# Patient Record
Sex: Male | Born: 1950 | Race: White | Hispanic: No | Marital: Married | Smoking: Former smoker
Health system: Southern US, Community
[De-identification: ages and names within clinical notes are randomized; demographics above are authoritative.]

## PROBLEM LIST (undated history)

## (undated) DIAGNOSIS — T7840XA Allergy, unspecified, initial encounter: Secondary | ICD-10-CM

## (undated) HISTORY — DX: Allergy, unspecified, initial encounter: T78.40XA

---

## 2012-06-19 ENCOUNTER — Ambulatory Visit: Payer: Self-pay | Admitting: Family Medicine

## 2012-06-19 VITALS — BP 155/84 | HR 79 | Temp 101.2°F | Resp 16 | Ht 71.5 in | Wt 224.0 lb

## 2012-06-19 DIAGNOSIS — J111 Influenza due to unidentified influenza virus with other respiratory manifestations: Secondary | ICD-10-CM

## 2012-06-19 MED ORDER — OSELTAMIVIR PHOSPHATE 75 MG PO CAPS: 75.0000 mg | ORAL_CAPSULE | Freq: Two times a day (BID) | ORAL | Status: DC

## 2012-06-19 MED ORDER — PROMETHAZINE-CODEINE 6.25-10 MG/5ML PO SYRP: 5.0000 mL | ORAL_SOLUTION | ORAL | Status: DC | PRN

## 2012-06-19 NOTE — Patient Instructions (Addendum)
Influenza Facts  Flu (influenza) is a contagious respiratory illness caused by the influenza viruses. It can cause mild to severe illness. While most healthy people recover from the flu without specific treatment and without complications, older people, young children, and people with certain health conditions are at higher risk for serious complications from the flu, including death.  CAUSES    The flu virus is spread from person to person by respiratory droplets from coughing and sneezing.   A person can also become infected by touching an object or surface with a virus on it and then touching their mouth, eye or nose.   Adults may be able to infect others from 1 day before symptoms occur and up to 7 days after getting sick. So it is possible to give someone the flu even before you know you are sick and continue to infect others while you are sick.  SYMPTOMS    Fever (usually high).   Headache.   Tiredness (can be extreme).   Cough.   Sore throat.   Runny or stuffy nose.   Body aches.   Diarrhea and vomiting may also occur, particularly in children.   These symptoms are referred to as "flu-like symptoms". A lot of different illnesses, including the common cold, can have similar symptoms.  DIAGNOSIS    There are tests that can determine if you have the flu as long you are tested within the first 2 or 3 days of illness.   A doctor's exam and additional tests may be needed to identify if you have a disease that is a complicating the flu.  RISKS AND COMPLICATIONS   Some of the complications caused by the flu include:   Bacterial pneumonia or progressive pneumonia caused by the flu virus.   Loss of body fluids (dehydration).   Worsening of chronic medical conditions, such as heart failure, asthma, or diabetes.   Sinus problems and ear infections.  HOME CARE INSTRUCTIONS    Seek medical care early on.   If you are at high risk from complications of the flu, consult your health-care provider as soon  as you develop flu-like symptoms. Those at high risk for complications include:   People 65 years or older.   People with chronic medical conditions, including diabetes.   Pregnant women.   Young children.   Your caregiver may recommend use of an antiviral medication to help treat the flu.   If you get the flu, get plenty of rest, drink a lot of liquids, and avoid using alcohol and tobacco.   You can take over-the-counter medications to relieve the symptoms of the flu if your caregiver approves. (Never give aspirin to children or teenagers who have flu-like symptoms, particularly fever).  PREVENTION   The single best way to prevent the flu is to get a flu vaccine each fall. Other measures that can help protect against the flu are:   Antiviral Medications   A number of antiviral drugs are approved for use in preventing the flu. These are prescription medications, and a doctor should be consulted before they are used.   Habits for Good Health   Cover your nose and mouth with a tissue when you cough or sneeze, throw the tissue away after you use it.   Wash your hands often with soap and water, especially after you cough or sneeze. If you are not near water, use an alcohol-based hand cleaner.   Avoid people who are sick.   If you get the   flu, stay home from work or school. Avoid contact with other people so that you do not make them sick, too.   Try not to touch your eyes, nose, or mouth as germs ore often spread this way.  IN CHILDREN, EMERGENCY WARNING SIGNS THAT NEED URGENT MEDICAL ATTENTION:   Fast breathing or trouble breathing.   Bluish skin color.   Not drinking enough fluids.   Not waking up or not interacting.   Being so irritable that the child does not want to be held.   Flu-like symptoms improve but then return with fever and worse cough.   Fever with a rash.  IN ADULTS, EMERGENCY WARNING SIGNS THAT NEED URGENT MEDICAL ATTENTION:   Difficulty breathing or shortness of breath.   Pain  or pressure in the chest or abdomen.   Sudden dizziness.   Confusion.   Severe or persistent vomiting.  SEEK IMMEDIATE MEDICAL CARE IF:   You or someone you know is experiencing any of the symptoms above. When you arrive at the emergency center,report that you think you have the flu. You may be asked to wear a mask and/or sit in a secluded area to protect others from getting sick.  MAKE SURE YOU:    Understand these instructions.   Monitor your condition.   Seek medical care if you are getting worse, or not improving.  Document Released: 05/26/2003 Document Revised: 08/15/2011 Document Reviewed: 02/19/2009  ExitCare Patient Information 2013 [employer].

## 2012-06-19 NOTE — Progress Notes (Signed)
  Subjective:    Patient ID: Russell Mckee, male    DOB: 06-13-1950, 62 y.o.   MRN: [id number]  HPI  Russell Mckee is a delightful 62 yo man with no sig PMHx.  He was in his normal state of health until last night after work when he developed some chills, which progressively worsened.  Was able to eat a little dinner. Overnight dev sweats.  This a.m. just felt cruddy and pharmacist told him to get checked out.  Usu HA rare but very bad now, no nasal cong, no ear pian, no sore throat. Mild dry cough. A little nausea but no appetite.  Urine dec but has been pushing fluids.  History reviewed. No pertinent past medical history. No current outpatient prescriptions on file prior to visit.   No Known Allergies   Review of Systems  Constitutional: Positive for fever, chills, diaphoresis, activity change, appetite change and fatigue. Negative for unexpected weight change.  HENT: Negative for ear pain, congestion, sore throat, neck stiffness and sinus pressure.   Respiratory: Positive for cough. Negative for shortness of breath.   Cardiovascular: Negative for chest pain.  Gastrointestinal: Positive for nausea. Negative for vomiting, abdominal pain, diarrhea and constipation.  Genitourinary: Positive for decreased urine volume. Negative for dysuria.  Musculoskeletal: Positive for myalgias and arthralgias. Negative for joint swelling and gait problem.  Skin: Negative for rash.  Neurological: Positive for headaches. Negative for syncope.  Hematological: Negative for adenopathy.  Psychiatric/Behavioral: Negative for sleep disturbance.      BP 155/84  Pulse 79  Temp 101.2 F (38.4 C) (Oral)  Resp 16  Ht 5' 11.5" (1.816 m)  Wt 224 lb (101.606 kg)  BMI 30.81 kg/m2 Objective:   Physical Exam  Constitutional: He is oriented to person, place, and time. He appears well-developed and well-nourished. He appears ill. No distress.  HENT:  Head: Normocephalic and atraumatic.  Right Ear: Tympanic  membrane, external ear and ear canal normal.  Left Ear: Tympanic membrane, external ear and ear canal normal.  Nose: Mucosal edema and rhinorrhea present.  Mouth/Throat: Uvula is midline and mucous membranes are normal. Posterior oropharyngeal edema present. No oropharyngeal exudate or posterior oropharyngeal erythema.  Eyes: Conjunctivae normal are normal. No scleral icterus.  Neck: Normal range of motion. Neck supple. No thyromegaly present.  Cardiovascular: Normal rate, regular rhythm, normal heart sounds and intact distal pulses.   Pulmonary/Chest: Effort normal and breath sounds normal. No respiratory distress.  Musculoskeletal: He exhibits no edema.  Lymphadenopathy:    He has no cervical adenopathy.  Neurological: He is alert and oriented to person, place, and time.  Skin: Skin is warm and dry. He is not diaphoretic. No erythema.  Psychiatric: He has a normal mood and affect. His behavior is normal.          Assessment & Plan:  Influenza - tamiflu and promethazine VC cough syrup. Alternate tylenol and ibuprofen for fevers. Rest, hygiene, push fluids. RTC if worsening.

## 2012-06-25 ENCOUNTER — Telehealth: Payer: Self-pay

## 2012-06-25 NOTE — Telephone Encounter (Signed)
Patient is not any better, the tramaflu is not working for him  Plae call [phone]

## 2012-06-26 ENCOUNTER — Ambulatory Visit (INDEPENDENT_AMBULATORY_CARE_PROVIDER_SITE_OTHER): Payer: BC Managed Care – PPO | Admitting: Family Medicine

## 2012-06-26 ENCOUNTER — Encounter: Payer: Self-pay | Admitting: Physician Assistant

## 2012-06-26 ENCOUNTER — Ambulatory Visit: Payer: BC Managed Care – PPO

## 2012-06-26 VITALS — BP 145/83 | HR 73 | Temp 98.1°F | Resp 16 | Ht 71.5 in | Wt 219.0 lb

## 2012-06-26 DIAGNOSIS — R05 Cough: Secondary | ICD-10-CM

## 2012-06-26 DIAGNOSIS — R059 Cough, unspecified: Secondary | ICD-10-CM

## 2012-06-26 DIAGNOSIS — J111 Influenza due to unidentified influenza virus with other respiratory manifestations: Secondary | ICD-10-CM

## 2012-06-26 DIAGNOSIS — J40 Bronchitis, not specified as acute or chronic: Secondary | ICD-10-CM

## 2012-06-26 DIAGNOSIS — R509 Fever, unspecified: Secondary | ICD-10-CM

## 2012-06-26 LAB — POCT CBC
HCT, POC: 49.8 % (ref 43.5–53.7)
Lymph, poc: 2 (ref 0.6–3.4)
MCHC: 32.3 g/dL (ref 31.8–35.4)
MID (cbc): 0.5 (ref 0–0.9)
MPV: 8.1 fL (ref 0–99.8)
POC Granulocyte: 3.7 (ref 2–6.9)
POC LYMPH PERCENT: 32.1 %L (ref 10–50)
POC MID %: 8.4 %M (ref 0–12)
Platelet Count, POC: 230 10*3/uL (ref 142–424)
RDW, POC: 13.5 %

## 2012-06-26 MED ORDER — PREDNISONE 20 MG PO TABS: ORAL_TABLET | ORAL | Status: DC

## 2012-06-26 MED ORDER — BENZONATATE 100 MG PO CAPS: 100.0000 mg | ORAL_CAPSULE | Freq: Three times a day (TID) | ORAL | Status: DC | PRN

## 2012-06-26 NOTE — Telephone Encounter (Signed)
Pt in office today being seen

## 2012-06-26 NOTE — Progress Notes (Signed)
Subjective:    Patient ID: Russell Mckee, male    DOB: [birth year], 62 y.o.   MRN: [id number]  HPI   Russell Mckee is a pleasant 62 yr old male who was seen here 7 days ago and diagnosed with influenza.  He completed a course of Tamiflu, but presents today stating he is not feeling better.  States he has "awful congestion" and states "I feel like I need some help".  Concerned for a secondary infection.  States he still feels feverish at times but does not have a thermometer and has not taken his temperature.  Denies chills.  Endorses one episode of night sweats last night.  The cough is productive of thick yellow sputum.  He denies nasal symptoms.  States he has occasional wheezing but denies SOB.  "I just feel weak."  Some nausea but no vomiting or diarrhea.  Appetite is ok, eating mostly soup.  States he does feel somewhat better, but thinks he should be feeling better than this by now.  Headache and body aches have resolved.  Still using cough syrup at night.     Review of Systems  Constitutional: Positive for fever, chills and diaphoresis.  HENT: Negative for ear pain, congestion, sore throat, rhinorrhea, sneezing, neck pain and neck stiffness.   Respiratory: Positive for cough, shortness of breath and wheezing.   Cardiovascular: Negative.   Gastrointestinal: Positive for nausea. Negative for vomiting and diarrhea.  Musculoskeletal: Negative for myalgias and arthralgias.  Skin: Negative.   Neurological: Negative for headaches.       Objective:   Physical Exam  Vitals reviewed. Constitutional: He is oriented to person, place, and time. He appears well-developed and well-nourished. No distress.  HENT:  Head: Normocephalic and atraumatic.  Right Ear: Tympanic membrane and ear canal normal.  Left Ear: Tympanic membrane and ear canal normal.  Mouth/Throat: Uvula is midline, oropharynx is clear and moist and mucous membranes are normal.  Eyes: Conjunctivae normal are normal. No scleral  icterus.  Neck: Neck supple.  Cardiovascular: Normal rate, regular rhythm, normal heart sounds and intact distal pulses.  Exam reveals no gallop and no friction rub.   No murmur heard. Pulmonary/Chest: Effort normal. No accessory muscle usage. Not tachypneic. No respiratory distress. He has no decreased breath sounds. He has no wheezes. He has rhonchi in the left middle field and the left lower field. He has no rales.  Abdominal: Soft. Bowel sounds are normal. There is no tenderness.  Lymphadenopathy:    He has no cervical adenopathy.  Neurological: He is alert and oriented to person, place, and time.  Skin: Skin is warm and dry.  Psychiatric: He has a normal mood and affect. His behavior is normal.      Filed Vitals:   06/26/12 0746  BP: 145/83  Pulse: 73  Temp: 98.1 F (36.7 C)  Resp: 16     [facility] reading (PRIMARY) by  Dr. Milus Glazier - heavy markings but no evidence of pneumonia    Results for orders placed in visit on 06/26/12  POCT CBC      Component Value Range   WBC 6.3  4.6 - 10.2 K/uL   Lymph, poc 2.0  0.6 - 3.4   POC LYMPH PERCENT 32.1  10 - 50 %L   MID (cbc) 0.5  0 - 0.9   POC MID % 8.4  0 - 12 %M   POC Granulocyte 3.7  2 - 6.9   Granulocyte percent 59.5  37 - 80 %G  RBC 5.60  4.69 - 6.13 M/uL   Hemoglobin 16.1  14.1 - 18.1 g/dL   HCT, POC 98.1  19.1 - 53.7 %   MCV 88.9  80 - 97 fL   MCH, POC 28.8  27 - 31.2 pg   MCHC 32.3  31.8 - 35.4 g/dL   RDW, POC 47.8     Platelet Count, POC 230  142 - 424 K/uL   MPV 8.1  0 - 99.8 fL         Assessment & Plan:   1. Bronchitis  benzonatate (TESSALON) 100 MG capsule, predniSONE (DELTASONE) 20 MG tablet  2. Cough  POCT CBC, DG Chest 2 View  3. Fever  POCT CBC, DG Chest 2 View  4. Influenza      Russell Mckee is a very pleasant 62 yr old male here with continued cough post-influenza.  WBC count is 6.3.  CXR shows no evidence of pneumonia.  Suspect post-influenza bronchitis.  Discussed with pt that I do not  think an antibiotic will be beneficial as there is no evidence of pneumonia.  Will try a steroid taper.  Will also add Tessalon to treat cough.  Pt may continue cough syrup at night if needed.  Encouraged continued fluids and rest.  Discussed RTC precautions, specifically continued fevers or worsening cough.  Pt understands and is in agreement.  Will let us know if worsening or not improving.

## 2012-06-26 NOTE — Patient Instructions (Addendum)
Your x-ray and blood count are both normal today.  Begin taking the steroid (prednisone) as directed.  Continue to use the cough syrup at night if needed.  You may also use Tessalon Perles during the day for cough if needed up to 3 times per day.  Continue drinking plenty of fluids.  You do not need to continue Advil or Tylenol unless you need it for pain relief.  If you begin to have fevers again, please let us know.  If you feel like your cough is worsening let us know.

## 2013-12-30 IMAGING — CR DG CHEST 2V
2 series · 2 of 2 positions shown · non-contrast
Comparison: None.

CLINICAL DATA: Cough and fatigue

CHEST - 2 VIEW

[PA]
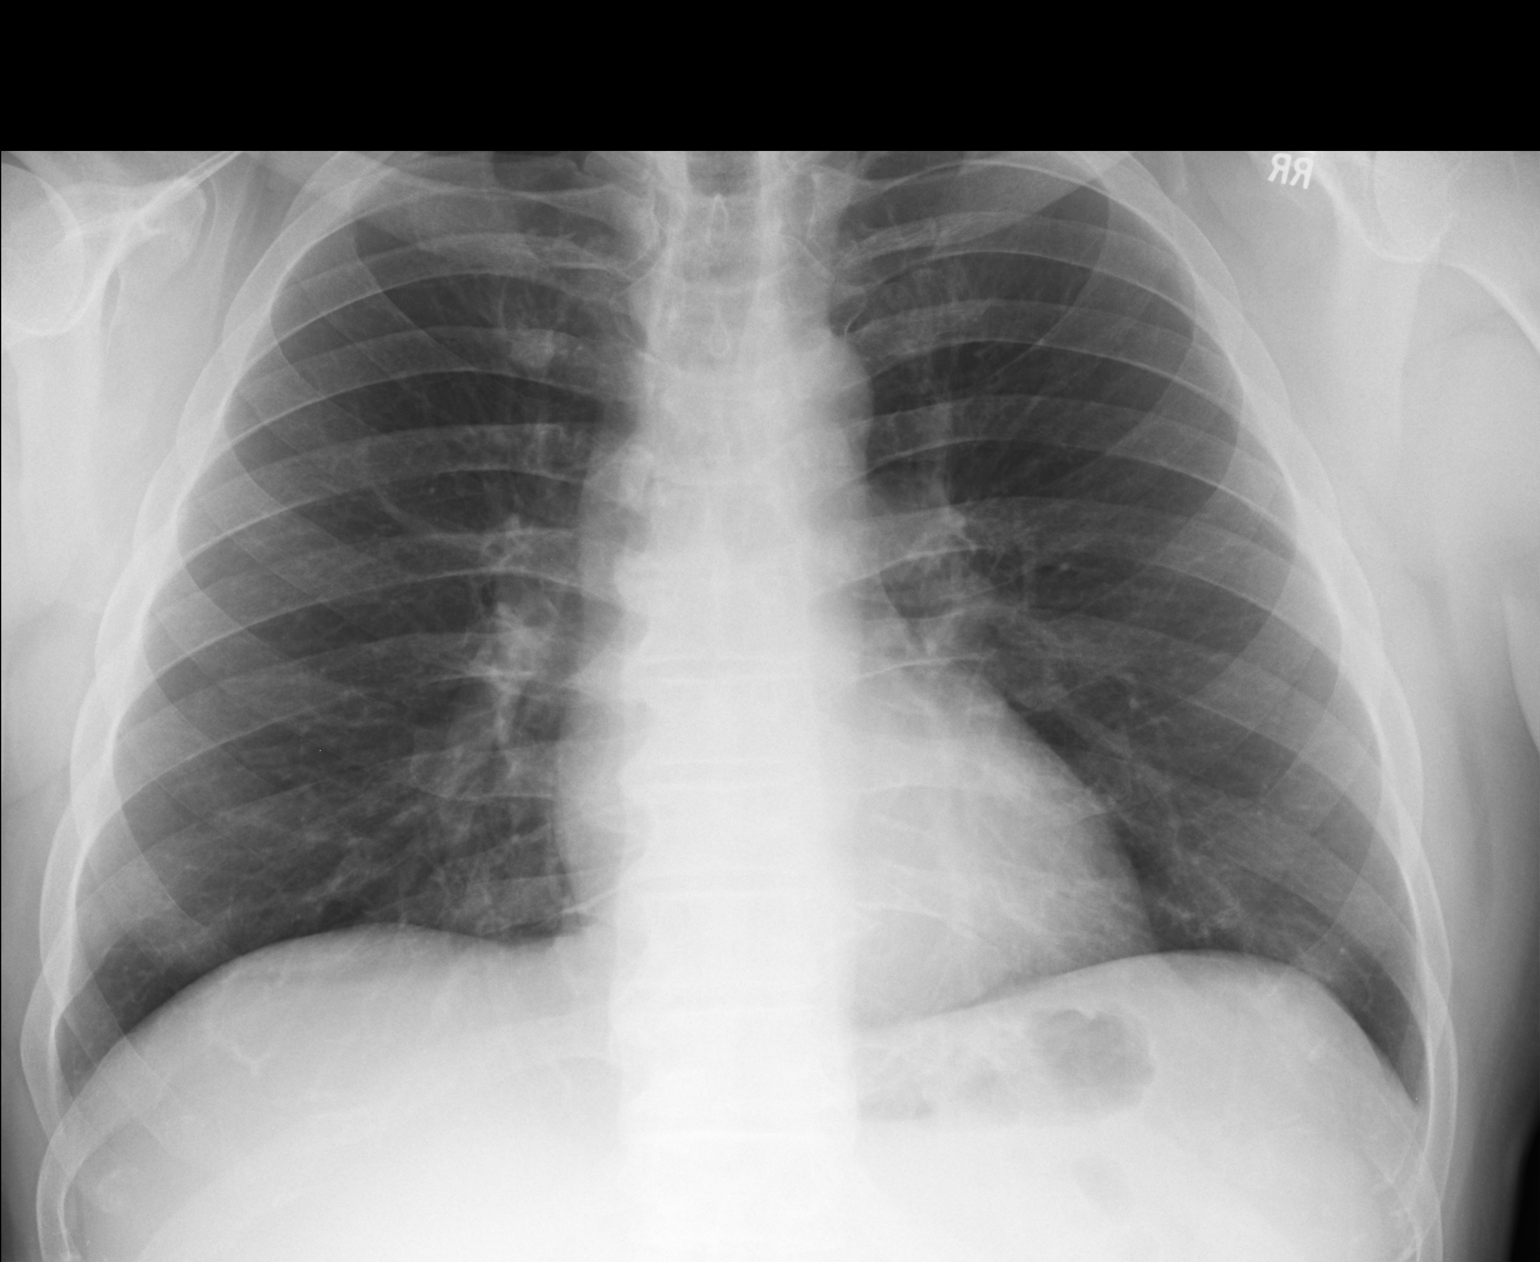

[lateral]
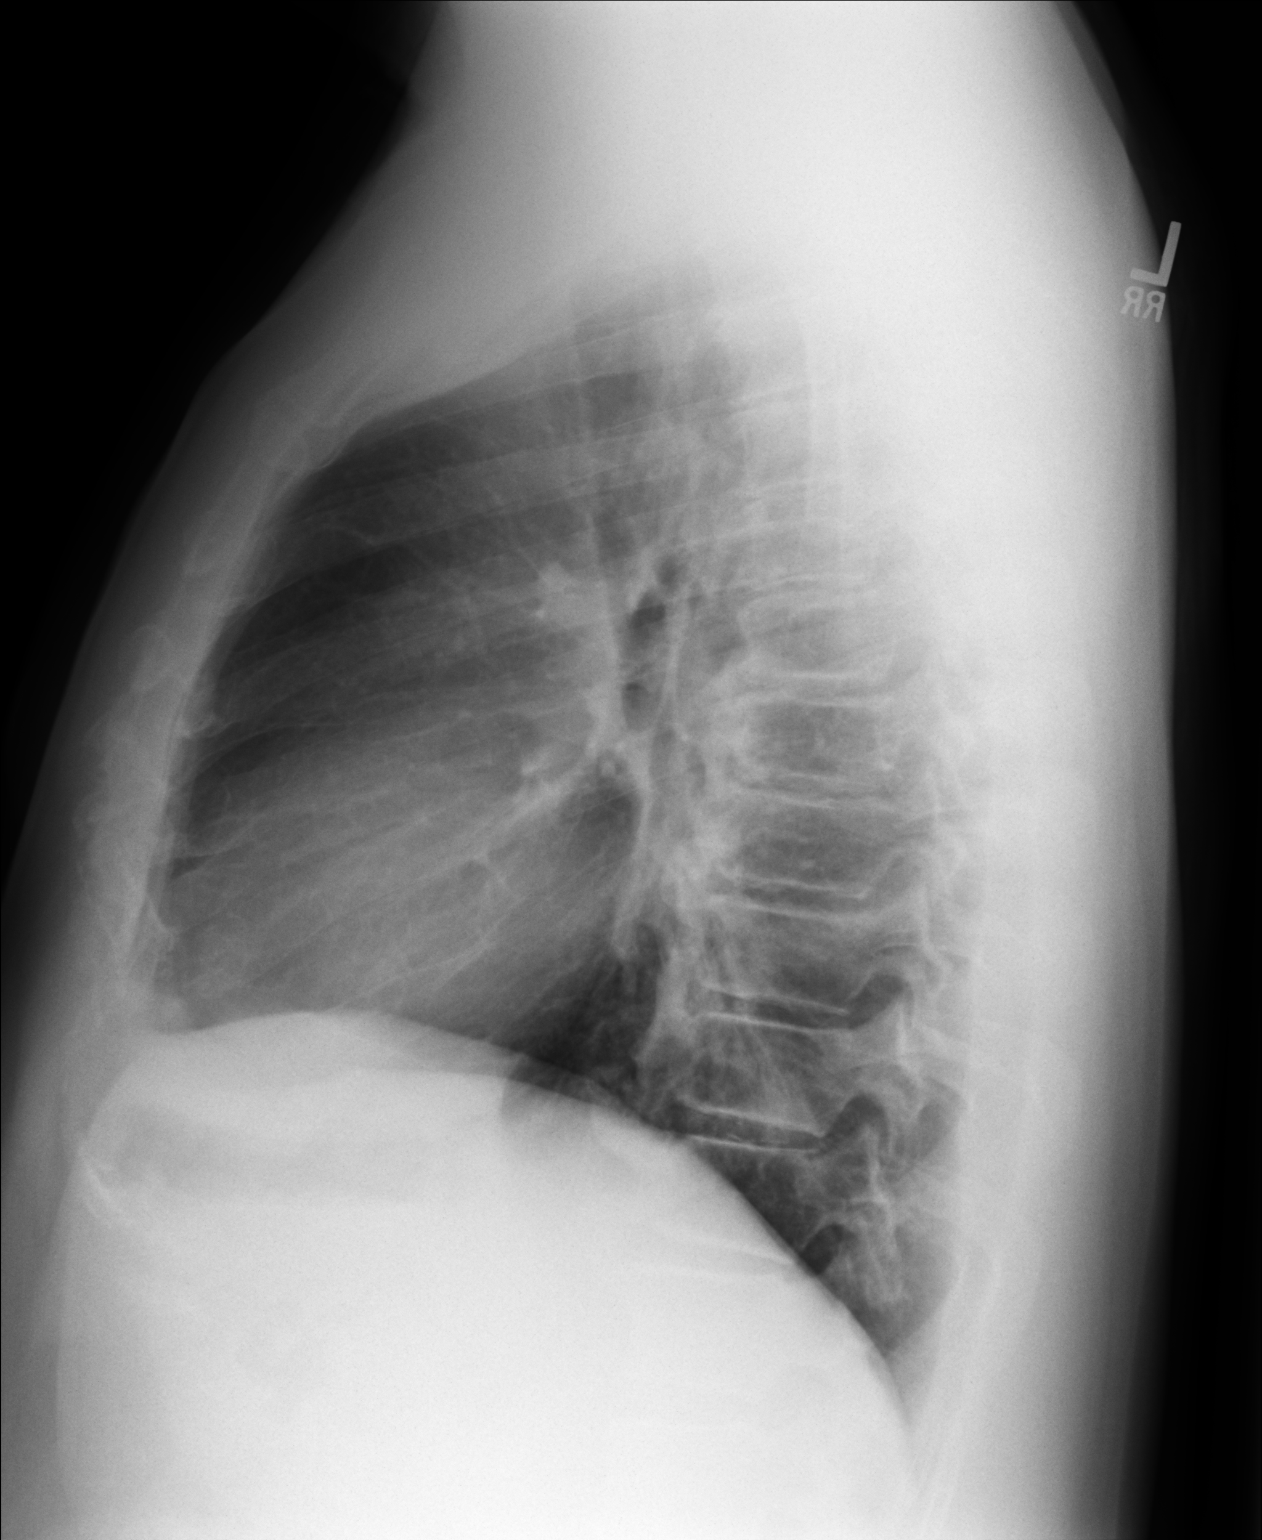

[2 of 2 positions shown; findings below may reference images not displayed]

FINDINGS: Normal mediastinum and cardiac silhouette.  There is some
linear central bronchovascular thickening.  No focal infiltrate.
No effusion, infiltrate, or pneumothorax peri Degenerative
osteophytosis of the thoracic spine.
IMPRESSION: No evidence of pneumonia.

## 2020-03-03 ENCOUNTER — Ambulatory Visit: Payer: Self-pay | Attending: Internal Medicine

## 2020-03-03 DIAGNOSIS — Z23 Encounter for immunization: Secondary | ICD-10-CM

## 2020-03-03 NOTE — Progress Notes (Signed)
   Covid-19 Vaccination Clinic  Name:  Russell Mckee    MRN: [id number] DOB: June 12, 1950  03/03/2020  Russell Mckee was observed post Covid-19 immunization for 15 minutes without incident. He was provided with Vaccine Information Sheet and instruction to access the V-Safe system.   Russell Mckee was instructed to call 911 with any severe reactions post vaccine: Marland Kitchen Difficulty breathing  . Swelling of face and throat  . A fast heartbeat  . A bad rash all over body  . Dizziness and weakness

## 2020-09-07 DIAGNOSIS — J069 Acute upper respiratory infection, unspecified: Secondary | ICD-10-CM | POA: Diagnosis not present

## 2020-10-13 DIAGNOSIS — L814 Other melanin hyperpigmentation: Secondary | ICD-10-CM | POA: Diagnosis not present

## 2020-10-13 DIAGNOSIS — D1801 Hemangioma of skin and subcutaneous tissue: Secondary | ICD-10-CM | POA: Diagnosis not present

## 2020-10-13 DIAGNOSIS — D224 Melanocytic nevi of scalp and neck: Secondary | ICD-10-CM | POA: Diagnosis not present

## 2020-10-13 DIAGNOSIS — D225 Melanocytic nevi of trunk: Secondary | ICD-10-CM | POA: Diagnosis not present

## 2020-10-13 DIAGNOSIS — D2271 Melanocytic nevi of right lower limb, including hip: Secondary | ICD-10-CM | POA: Diagnosis not present

## 2020-10-13 DIAGNOSIS — L570 Actinic keratosis: Secondary | ICD-10-CM | POA: Diagnosis not present

## 2021-11-15 ENCOUNTER — Ambulatory Visit: Payer: PPO | Admitting: Podiatry

## 2021-11-15 ENCOUNTER — Encounter: Payer: Self-pay | Admitting: Podiatry

## 2021-11-15 ENCOUNTER — Other Ambulatory Visit: Payer: PPO

## 2021-11-15 ENCOUNTER — Ambulatory Visit (INDEPENDENT_AMBULATORY_CARE_PROVIDER_SITE_OTHER): Payer: PPO

## 2021-11-15 DIAGNOSIS — M79672 Pain in left foot: Secondary | ICD-10-CM | POA: Diagnosis not present

## 2021-11-15 DIAGNOSIS — M79671 Pain in right foot: Secondary | ICD-10-CM

## 2021-11-15 DIAGNOSIS — M722 Plantar fascial fibromatosis: Secondary | ICD-10-CM | POA: Diagnosis not present

## 2021-11-15 DIAGNOSIS — M2041 Other hammer toe(s) (acquired), right foot: Secondary | ICD-10-CM

## 2021-11-15 MED ORDER — TRIAMCINOLONE ACETONIDE 10 MG/ML IJ SUSP
10.0000 mg | Freq: Once | INTRAMUSCULAR | Status: AC
  Administered 2021-11-15: 10 mg

## 2021-11-15 NOTE — Progress Notes (Signed)
Subjective:   Patient ID: Russell Mckee, male   DOB: 71 y.o.   MRN: [id number]   HPI Patient presents with a number of months pain in the left plantar heel its been very sore and also movement of the right second digit and medial and upwards direction.  States he likes to walk and has been unable to for the last few months because of pain.  Patient does not smoke likes to be active   Review of Systems  All other systems reviewed and are negative.       Objective:  Physical Exam Vitals and nursing note reviewed.  Constitutional:      Appearance: He is well-developed.  Pulmonary:     Effort: Pulmonary effort is normal.  Musculoskeletal:        General: Normal range of motion.  Skin:    General: Skin is warm.  Neurological:     Mental Status: He is alert.     Neurovascular status found to be intact muscle strength found to be adequate range of motion within normal limits.  Patient does have moderate equinus is noted on the left plantar fascia to have exquisite discomfort in the medial band insertion into calcaneus and on the right there is medial and dorsal dislocation of the second digit moderate in its intensity.  Patient has good digital perfusion well oriented x3     Assessment:  Acute plantar fasciitis left with inflammation fluid buildup along with probability for flexor plate dislocation second MPJ right     Plan:  H&P reviewed condition and went ahead today did sterile prep and injected the plantar fascial insertion left 3 mg Kenalog 5 mg Xylocaine and instructed on fascial bracing and instructed on the usage of this to take stress off the plantar heel and reduce the weightbearing forces.  Do not recommend current treatment of right but may require digital fusion that I made him aware of  X-rays indicate spur formation plantar heel no indication stress fracture arthritis and right did indicate medial dorsal dislocation of the digit

## 2021-11-15 NOTE — Patient Instructions (Signed)

## 2021-11-19 ENCOUNTER — Ambulatory Visit (INDEPENDENT_AMBULATORY_CARE_PROVIDER_SITE_OTHER): Payer: PPO | Admitting: Family Medicine

## 2021-11-19 ENCOUNTER — Encounter: Payer: Self-pay | Admitting: Family Medicine

## 2021-11-19 VITALS — BP 140/90 | HR 58 | Temp 98.0°F | Ht 71.5 in | Wt 215.0 lb

## 2021-11-19 DIAGNOSIS — Z0000 Encounter for general adult medical examination without abnormal findings: Secondary | ICD-10-CM

## 2021-11-19 DIAGNOSIS — Z13220 Encounter for screening for lipoid disorders: Secondary | ICD-10-CM

## 2021-11-19 DIAGNOSIS — Z1211 Encounter for screening for malignant neoplasm of colon: Secondary | ICD-10-CM | POA: Diagnosis not present

## 2021-11-19 DIAGNOSIS — Z1159 Encounter for screening for other viral diseases: Secondary | ICD-10-CM | POA: Diagnosis not present

## 2021-11-19 LAB — COMPREHENSIVE METABOLIC PANEL
ALT: 15 U/L (ref 0–53)
AST: 24 U/L (ref 0–37)
Albumin: 4.7 g/dL (ref 3.5–5.2)
Alkaline Phosphatase: 43 U/L (ref 39–117)
BUN: 14 mg/dL (ref 6–23)
CO2: 27 mEq/L (ref 19–32)
Calcium: 10.1 mg/dL (ref 8.4–10.5)
Chloride: 100 mEq/L (ref 96–112)
Creatinine, Ser: 1.01 mg/dL (ref 0.40–1.50)
GFR: 75.82 mL/min (ref >60.00)
Glucose, Bld: 93 mg/dL (ref 70–99)
Potassium: 4.8 mEq/L (ref 3.5–5.1)
Sodium: 136 mEq/L (ref 135–145)
Total Bilirubin: 0.7 mg/dL (ref 0.2–1.2)
Total Protein: 7.7 g/dL (ref 6.0–8.3)

## 2021-11-19 LAB — CBC WITH DIFFERENTIAL/PLATELET
Basophils Absolute: 0 10*3/uL (ref 0.0–0.1)
Basophils Relative: 0.3 % (ref 0.0–3.0)
Eosinophils Absolute: 0.1 10*3/uL (ref 0.0–0.7)
Eosinophils Relative: 1.7 % (ref 0.0–5.0)
HCT: 47.8 % (ref 39.0–52.0)
Hemoglobin: 16.1 g/dL (ref 13.0–17.0)
Lymphocytes Relative: 30.1 % (ref 12.0–46.0)
Lymphs Abs: 2.3 10*3/uL (ref 0.7–4.0)
MCHC: 33.8 g/dL (ref 30.0–36.0)
MCV: 86.1 fl (ref 78.0–100.0)
Monocytes Absolute: 0.4 10*3/uL (ref 0.1–1.0)
Monocytes Relative: 5.8 % (ref 3.0–12.0)
Neutro Abs: 4.8 10*3/uL (ref 1.4–7.7)
Neutrophils Relative %: 62.1 % (ref 43.0–77.0)
Platelets: 241 10*3/uL (ref 150.0–400.0)
RBC: 5.55 Mil/uL (ref 4.22–5.81)
RDW: 14 % (ref 11.5–15.5)
WBC: 7.8 10*3/uL (ref 4.0–10.5)

## 2021-11-19 LAB — LIPID PANEL
Cholesterol: 230 mg/dL — ABNORMAL HIGH (ref 0–200)
HDL: 62.2 mg/dL (ref >39.00)
LDL Cholesterol: 139 mg/dL — ABNORMAL HIGH (ref 0–99)
NonHDL: 167.47
Total CHOL/HDL Ratio: 4
Triglycerides: 140 mg/dL (ref 0.0–149.0)
VLDL: 28 mg/dL (ref 0.0–40.0)

## 2021-11-19 NOTE — Patient Instructions (Signed)
Welcome to [facility]! It was a pleasure meeting you today.  As discussed, Please schedule a 12 month follow up visit today.  Check pharmacy for Tdap  PLEASE NOTE:  If you had any LAB tests please let us know if you have not heard back within a few days. You may see your results on MyChart before we have a chance to review them but we will give you a call once they are reviewed by [place]. If we ordered any REFERRALS today, please let us know if you have not heard from their office within the next week.  Let us know through MyChart if you are needing REFILLS, or have your pharmacy send [place] the request. You can also use MyChart to communicate with me or any office staff.  Please try these tips to maintain a healthy lifestyle:  Eat most of your calories during the day when you are active. Eliminate processed foods including packaged sweets (pies, cakes, cookies), reduce intake of potatoes, white bread, white pasta, and white rice. Look for whole grain options, oat flour or almond flour.  Each meal should contain half fruits/vegetables, one quarter protein, and one quarter carbs (no bigger than a computer mouse).  Cut down on sweet beverages. This includes juice, soda, and sweet tea. Also watch fruit intake, though this is a healthier sweet option, it still contains natural sugar! Limit to 3 servings daily.  Drink at least 1 glass of water with each meal and aim for at least 8 glasses per day  Exercise at least 150 minutes every week.

## 2021-11-19 NOTE — Progress Notes (Signed)
New Patient Office Visit  Subjective:  Patient ID: Russell Mckee, male    DOB: [birth year]  Age: 71 y.o. MRN: [id number]  CC:  Chief Complaint  Patient presents with   Establish Care    Need new PCP Not fasting     HPI Russell Mckee presents for new pt to est Exercises 1-1.5 hrs/day Since April pain L heel so stopped walking.  Saw Dr. Paulla Dolly this week. Told plantar fasciitis and got cortisone and brace and start walking.  Cpx-healthy diet. Still working(owns business). No other major complaints.    Past Medical History:  Diagnosis Date   Allergy     History reviewed. No pertinent surgical history.  Family History  Problem Relation Age of Onset   Diabetes Mother    Lung cancer Mother    Dementia Father    Aneurysm Sister    Heart disease Maternal Grandmother    Early death Maternal Grandmother    Diabetes Maternal Grandmother    Breast cancer Maternal Grandmother    Stroke Maternal Grandfather     Social History   Socioeconomic History   Marital status: Single    Spouse name: Not on file   Number of children: 0   Years of education: Not on file   Highest education level: Not on file  Occupational History   Not on file  Tobacco Use   Smoking status: Former    Types: Cigarettes    Quit date: 1979    Years since quitting: 44.4   Smokeless tobacco: Never  Vaping Use   Vaping Use: Not on file  Substance and Sexual Activity   Alcohol use: Yes    Alcohol/week: 2.0 standard drinks of alcohol    Types: 2 Shots of liquor per week   Drug use: Not Currently   Sexual activity: Never  Other Topics Concern   Not on file  Social History Narrative   [employer]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    Social Determinants of Health   Financial Resource Strain: Not on file  Food Insecurity: Not on file  Transportation Needs: Not on file  Physical Activity: Not on file  Stress: Not on file  Social Connections: Not on file  Intimate Partner Violence:  Not on file    ROS  ROS: Gen: no fever, chills  Skin: no rash, itching ENT: no ear pain, ear drainage, nasal congestion, rhinorrhea, sinus pressure, sore throat Eyes: no blurry vision, double vision Resp: no cough, wheeze,SOB CV: no CP, palpitations, LE edema,  GI: no heartburn, n/v/d/c, abd pain GU: no dysuria, urgency, frequency, hematuria MSK: knees Neuro: no dizziness, headache, weakness, vertigo Psych: no depression, anxiety, insomnia, SI   Objective:   Today's Vitals: BP 140/90   Pulse (!) 58   Temp 98 F (36.7 C) (Temporal)   Ht 5' 11.5" (1.816 m)   Wt 215 lb (97.5 kg)   SpO2 96%   BMI 29.57 kg/m   Physical Exam  Gen: WDWN NAD wm HEENT: NCAT, conjunctiva not injected, sclera nonicteric TM WNL B, OP moist, no exudates  NECK:  supple, no thyromegaly, no nodes, no carotid bruits CARDIAC: RRR, S1S2+, no murmur. DP 2+B LUNGS: CTAB. No wheezes ABDOMEN:  BS+, soft, NTND, No HSM, no masses EXT:  no edema MSK: no gross abnormalities.  NEURO: A&O x3.  CN II-XII intact.  PSYCH: normal mood. Good eye contact   Assessment & Plan:   Problem List Items Addressed This Visit   None Visit Diagnoses  Wellness examination    -  Primary   Relevant Orders   CBC with Differential/Platelet   Comprehensive metabolic panel   Screen for colon cancer       Relevant Orders   Ambulatory referral to Gastroenterology   Screening for lipid disorders       Relevant Orders   Lipid panel   Encounter for hepatitis C screening test for low risk patient       Relevant Orders   Hepatitis C antibody      [place] guidance.  Advised to get Tdap even if cash pay d/t occupation.  Screening hep C, pt has done HIV in past.  Refer colon.  Check lipids.  Will check cbc,cmp-Knows cash pay for cbc/cmp.  Monitors bp's at home 120's/70's.  Cont to monitor.  F/u 1 yr and prn  Outpatient Encounter Medications as of 11/19/2021  Medication Sig   ibuprofen (ADVIL) 200 MG tablet Take 200 mg by  mouth daily.   MELATONIN PO Take 2 mg by mouth at bedtime.   [DISCONTINUED] benzonatate (TESSALON) 100 MG capsule Take 1-2 capsules (100-200 mg total) by mouth 3 (three) times daily as needed for cough.   [DISCONTINUED] oseltamivir (TAMIFLU) 75 MG capsule Take 1 capsule (75 mg total) by mouth 2 (two) times daily.   [DISCONTINUED] predniSONE (DELTASONE) 20 MG tablet Take 3 PO QAM x3days, 2 PO QAM x3days, 1 PO QAM x3days   [DISCONTINUED] promethazine-codeine (PHENERGAN WITH CODEINE) 6.25-10 MG/5ML syrup Take 5 mLs by mouth every 4 (four) hours as needed for cough.   No facility-administered encounter medications on file as of 11/19/2021.    Follow-up: Return in about 1 year (around 11/20/2022) for annual.   Wellington Hampshire, MD

## 2021-11-22 LAB — HEPATITIS C ANTIBODY
Hepatitis C Ab: NONREACTIVE
SIGNAL TO CUT-OFF: 0.03 (ref <1.00)

## 2021-12-09 ENCOUNTER — Ambulatory Visit (INDEPENDENT_AMBULATORY_CARE_PROVIDER_SITE_OTHER): Payer: PPO | Admitting: Podiatry

## 2021-12-09 DIAGNOSIS — M722 Plantar fascial fibromatosis: Secondary | ICD-10-CM

## 2021-12-10 NOTE — Progress Notes (Signed)
Subjective:   Patient ID: Russell Mckee, male   DOB: 71 y.o.   MRN: [id number]   HPI Patient states he is improved states he still has mild discomfort but it is gotten quite a bit better than previous   ROS      Objective:  Physical Exam  Neurovascular status intact with discomfort plantar fascia still present but better than it was previously     Assessment:  Improvement in plantar fascial inflammation with pain still present but quite a bit better     Plan:  Reviewed physical therapy anti-inflammatories and supportive shoe gear usage and at this point I am discharging but patient is encouraged to call with questions concerns which may arise

## 2022-02-28 ENCOUNTER — Encounter: Payer: Self-pay | Admitting: *Deleted

## 2022-03-04 ENCOUNTER — Telehealth: Payer: Self-pay | Admitting: Family Medicine

## 2022-03-04 NOTE — Telephone Encounter (Signed)
Copied from [place] [id number]. Topic: Medicare AWV >> Mar 04, 2022  9:28 AM Devoria Glassing wrote: Reason for CRM: Left message for patient to schedule Annual Wellness Visit.  Please schedule with Nurse Health Advisor Charlott Rakes, RN at [facility]. This appt can be telephone or office visit. Please call [phone] ask for [facility]

## 2022-03-24 ENCOUNTER — Telehealth: Payer: Self-pay | Admitting: Family Medicine

## 2022-03-24 NOTE — Telephone Encounter (Signed)
Copied from [place] [phone]. Topic: Medicare AWV >> Mar 24, 2022 11:57 AM Devoria Glassing wrote: Reason for CRM: Left message for patient to schedule Annual Wellness Visit.  Please schedule with Nurse Health Advisor Charlott Rakes, RN at [facility]. This appt can be telephone or office visit. Please call [phone] ask for [facility]

## 2022-03-31 ENCOUNTER — Telehealth: Payer: Self-pay | Admitting: Family Medicine

## 2022-03-31 NOTE — Telephone Encounter (Signed)
Copied from [place] [id number]. Topic: Medicare AWV >> Mar 31, 2022  9:40 AM Gillis Santa wrote: Reason for CRM: LEFT VOICEMAIL FOR PT TO CALL [phone] TO SCHEDULE AWVI WITH HEALTH COACH [facility]

## 2022-05-19 ENCOUNTER — Encounter: Payer: Self-pay | Admitting: *Deleted

## 2022-05-23 ENCOUNTER — Telehealth: Payer: Self-pay | Admitting: Family Medicine

## 2022-05-23 NOTE — Telephone Encounter (Signed)
Copied from [place] [phone]. Topic: Medicare AWV >> May 23, 2022  9:20 AM Gillis Santa wrote: Reason for CRM: LVM PATIENT TO CALL [phone] [place]

## 2022-06-13 ENCOUNTER — Ambulatory Visit (INDEPENDENT_AMBULATORY_CARE_PROVIDER_SITE_OTHER): Payer: PPO

## 2022-06-13 VITALS — Wt 212.0 lb

## 2022-06-13 DIAGNOSIS — Z0000 Encounter for general adult medical examination without abnormal findings: Secondary | ICD-10-CM

## 2022-06-13 NOTE — Progress Notes (Signed)
I connected with  Russell Mckee on 06/13/22 by a audio enabled telemedicine application and verified that I am speaking with the correct person using two identifiers.  Patient Location: Home  Provider Location: Office/Clinic  I discussed the limitations of evaluation and management by telemedicine. The patient expressed understanding and agreed to proceed.   Subjective:   Russell Mckee is a 72 y.o. male who presents for an Initial Medicare Annual Wellness Visit.  Review of Systems     Cardiac Risk Factors include: advanced age (>25mn, >>56women);male gender     Objective:    Today's Vitals   06/13/22 1411  Weight: 212 lb (96.2 kg)   Body mass index is 29.16 kg/m.     06/13/2022    2:15 PM  Advanced Directives  Does Patient Have a Medical Advance Directive? Yes  Type of AParamedicof AEstell ManorLiving will  Copy of [place] Chart? No - copy requested    Current Medications (verified) Outpatient Encounter Medications as of 06/13/2022  Medication Sig   ibuprofen (ADVIL) 200 MG tablet Take 200 mg by mouth daily.   MELATONIN PO Take 2 mg by mouth at bedtime.   No facility-administered encounter medications on file as of 06/13/2022.    Allergies (verified) Patient has no known allergies.   History: Past Medical History:  Diagnosis Date   Allergy    History reviewed. No pertinent surgical history. Family History  Problem Relation Age of Onset   Diabetes Mother    Lung cancer Mother    Dementia Father    Aneurysm Sister    Heart disease Maternal Grandmother    Early death Maternal Grandmother    Diabetes Maternal Grandmother    Breast cancer Maternal Grandmother    Stroke Maternal Grandfather    Social History   Socioeconomic History   Marital status: Single    Spouse name: Not on file   Number of children: 0   Years of education: Not on file   Highest education level: Not on file  Occupational History    Not on file  Tobacco Use   Smoking status: Former    Types: Cigarettes    Quit date: 1979    Years since quitting: 45.0   Smokeless tobacco: Never  Vaping Use   Vaping Use: Not on file  Substance and Sexual Activity   Alcohol use: Yes    Alcohol/week: 2.0 standard drinks of alcohol    Types: 2 Shots of liquor per week   Drug use: Not Currently   Sexual activity: Never  Other Topics Concern   Not on file  Social History Narrative   [employer]-florist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    Social Determinants of Health   Financial Resource Strain: Low Risk  (06/13/2022)   Overall Financial Resource Strain (CARDIA)    Difficulty of Paying Living Expenses: Not hard at all  Food Insecurity: No Food Insecurity (06/13/2022)   Hunger Vital Sign    Worried About Running Out of Food in the Last Year: Never true    Ran Out of Food in the Last Year: Never true  Transportation Needs: No Transportation Needs (06/13/2022)   PRAPARE - THydrologist(Medical): No    Lack of Transportation (Non-Medical): No  Physical Activity: Sufficiently Active (06/13/2022)   Exercise Vital Sign    Days of Exercise per Week: 5 days    Minutes of Exercise per Session: 70 min  Stress: No  Stress Concern Present (06/13/2022)   [place]    Feeling of Stress : Not at all  Social Connections: Moderately Isolated (06/13/2022)   Social Connection and Isolation Panel [NHANES]    Frequency of Communication with Friends and Family: More than three times a week    Frequency of Social Gatherings with Friends and Family: More than three times a week    Attends Religious Services: Never    Marine scientist or Organizations: Yes    Attends Music therapist: 1 to 4 times per year    Marital Status: Never married    Tobacco Counseling Counseling given: Not Answered   Clinical Intake:  Pre-visit preparation  completed: Yes  Pain : No/denies pain     BMI - recorded: 29.16 Nutritional Status: BMI 25 -29 Overweight Nutritional Risks: None Diabetes: No  How often do you need to have someone help you when you read instructions, pamphlets, or other written materials from your doctor or pharmacy?: 1 - Never  Diabetic?no  Interpreter Needed?: No  Information entered by :: Charlott Rakes, LPN   Activities of Daily Living    06/13/2022    2:16 PM  In your present state of health, do you have any difficulty performing the following activities:  Hearing? 0  Vision? 0  Difficulty concentrating or making decisions? 0  Walking or climbing stairs? 0  Dressing or bathing? 0  Doing errands, shopping? 0  Preparing Food and eating ? N  Using the Toilet? N  In the past six months, have you accidently leaked urine? N  Do you have problems with loss of bowel control? N  Managing your Medications? N  Managing your Finances? N  Housekeeping or managing your Housekeeping? N    Patient Care Team: Tawnya Crook, MD as PCP - General (Family Medicine)  Indicate any recent Medical Services you may have received from other than [facility] providers in the past year (date may be approximate).     Assessment:   This is a routine wellness examination for Russell Mckee.  Hearing/Vision screen Hearing Screening - Comments:: Pt denies any hearing issues  Vision Screening - Comments:: Pt follows up with [facility] on [address]   Dietary issues and exercise activities discussed: Current Exercise Habits: Home exercise routine, Type of exercise: walking;Other - see comments, Time (Minutes): > 60, Frequency (Times/Week): 5, Weekly Exercise (Minutes/Week): 0   Goals Addressed             This Visit's Progress    Patient Stated       Live a healthy lifestyle        Depression Screen    06/13/2022    2:13 PM 11/19/2021   10:42 AM 11/19/2021   10:41 AM  PHQ 2/9 Scores  PHQ - 2 Score 0 0 0  PHQ- 9  Score  1     Fall Risk    06/13/2022    2:16 PM 11/19/2021   10:33 AM  Fall Risk   Falls in the past year? 0 1  Number falls in past yr: 0 0  Injury with Fall? 0 0  Comment  sore knee  Risk for fall due to : Impaired vision Other (Comment)  Risk for fall due to: Comment  slipped  Follow up Falls prevention discussed Falls prevention discussed    FALL RISK PREVENTION PERTAINING TO THE HOME:  Any stairs in or around the home? Yes  If so, are there any without handrails? No  Home free of loose throw rugs in walkways, pet beds, electrical cords, etc? Yes  Adequate lighting in your home to reduce risk of falls? Yes   ASSISTIVE DEVICES UTILIZED TO PREVENT FALLS:  Life alert? No  Use of a cane, walker or w/c? No  Grab bars in the bathroom? Yes  Shower chair or bench in shower? Yes  Elevated toilet seat or a handicapped toilet? No   TIMED UP AND GO:  Was the test performed? No .   Cognitive Function:        06/13/2022    2:16 PM  6CIT Screen  What Year? 0 points  What month? 0 points  What time? 0 points  Count back from 20 0 points  Months in reverse 0 points  Repeat phrase 0 points  Total Score 0 points    Immunizations Immunization History  Administered Date(s) Administered   COVID-19, mRNA, vaccine(Comirnaty)12 years and older 04/06/2022   Influenza-Unspecified 04/06/2022   PFIZER Comirnaty(Gray Top)Covid-19 Tri-Sucrose Vaccine 03/03/2020   PFIZER(Purple Top)SARS-COV-2 Vaccination 07/10/2019, 07/31/2019, 03/03/2020, 09/28/2020   Pfizer Covid-19 Vaccine Bivalent Booster 71yr & up 02/16/2021   Pneumococcal Conjugate-13 05/11/2021   Pneumococcal Polysaccharide-23 09/14/2020   RSV,unspecified 04/06/2022   Zoster Recombinat (Shingrix) 02/16/2021, 05/27/2021    TDAP status: Due, Education has been provided regarding the importance of this vaccine. Advised may receive this vaccine at local pharmacy or Health Dept. Aware to provide a copy of the vaccination record if  obtained from local pharmacy or Health Dept. Verbalized acceptance and understanding.  Flu Vaccine status: Up to date  Pneumococcal vaccine status: Up to date  Covid-19 vaccine status: Completed vaccines  Qualifies for Shingles Vaccine? Yes   Zostavax completed Yes   Shingrix Completed?: Yes  Screening Tests Health Maintenance  Topic Date Due   DTaP/Tdap/Td (1 - Tdap) Never done   COLONOSCOPY (Pts 45-447yrInsurance coverage will need to be confirmed)  Never done   Medicare Annual Wellness (AWV)  06/14/2023   Pneumonia Vaccine 6560Years old  Completed   INFLUENZA VACCINE  Completed   COVID-19 Vaccine  Completed   Hepatitis C Screening  Completed   Zoster Vaccines- Shingrix  Completed   HPV VACCINES  Aged Out    Health Maintenance  Health Maintenance Due  Topic Date Due   DTaP/Tdap/Td (1 - Tdap) Never done   COLONOSCOPY (Pts 45-4944yrnsurance coverage will need to be confirmed)  Never done    Colorectal cancer screening: Referral to GI placed 06/13/22. Pt aware the office will call re: appt.   Additional Screening:  Hepatitis C Screening: Completed 11/19/21  Vision Screening: Recommended annual ophthalmology exams for early detection of glaucoma and other disorders of the eye. Is the patient up to date with their annual eye exam?  Yes  Who is the provider or what is the name of the office in which the patient attends annual eye exams? [facility]  If pt is not established with a provider, would they like to be referred to a provider to establish care? No .   Dental Screening: Recommended annual dental exams for proper oral hygiene  Community Resource Referral / Chronic Care Management: CRR required this visit?  No   CCM required this visit?  No      Plan:     I have personally reviewed and noted the following in the patient's chart:   Medical and social history Use of alcohol, tobacco or illicit  drugs  Current medications and supplements including opioid  prescriptions. Patient is not currently taking opioid prescriptions. Functional ability and status Nutritional status Physical activity Advanced directives List of other physicians Hospitalizations, surgeries, and ER visits in previous 12 months Vitals Screenings to include cognitive, depression, and falls Referrals and appointments  In addition, I have reviewed and discussed with patient certain preventive protocols, quality metrics, and best practice recommendations. A written personalized care plan for preventive services as well as general preventive health recommendations were provided to patient.     Willette Brace, LPN   0/12/2180   Nurse Notes: none

## 2022-06-13 NOTE — Patient Instructions (Signed)
Russell Mckee , Thank you for taking time to come for your Medicare Wellness Visit. I appreciate your ongoing commitment to your health goals. Please review the following plan we discussed and let me know if I can assist you in the future.   These are the goals we discussed:  Goals      Patient Stated     Live a healthy lifestyle         This is a list of the screening recommended for you and due dates:  Health Maintenance  Topic Date Due   DTaP/Tdap/Td vaccine (1 - Tdap) Never done   Colon Cancer Screening  Never done   Medicare Annual Wellness Visit  06/14/2023   Pneumonia Vaccine  Completed   Flu Shot  Completed   COVID-19 Vaccine  Completed   Hepatitis C Screening: USPSTF Recommendation to screen - Ages 6-79 yo.  Completed   Zoster (Shingles) Vaccine  Completed   HPV Vaccine  Aged Out    Advanced directives: Please bring a copy of your health care power of attorney and living will to the office at your convenience.  Conditions/risks identified: keep  living a healthy lifestyle  Next appointment: Follow up in one year for your annual wellness visit.   Preventive Care 53 Years and Older, Male  Preventive care refers to lifestyle choices and visits with your health care provider that can promote health and wellness. What does preventive care include? A yearly physical exam. This is also called an annual well check. Dental exams once or twice a year. Routine eye exams. Ask your health care provider how often you should have your eyes checked. Personal lifestyle choices, including: Daily care of your teeth and gums. Regular physical activity. Eating a healthy diet. Avoiding tobacco and drug use. Limiting alcohol use. Practicing safe sex. Taking low doses of aspirin every day. Taking vitamin and mineral supplements as recommended by your health care provider. What happens during an annual well check? The services and screenings done by your health care provider during  your annual well check will depend on your age, overall health, lifestyle risk factors, and family history of disease. Counseling  Your health care provider may ask you questions about your: Alcohol use. Tobacco use. Drug use. Emotional well-being. Home and relationship well-being. Sexual activity. Eating habits. History of falls. Memory and ability to understand (cognition). Work and work Statistician. Screening  You may have the following tests or measurements: Height, weight, and BMI. Blood pressure. Lipid and cholesterol levels. These may be checked every 5 years, or more frequently if you are over 51 years old. Skin check. Lung cancer screening. You may have this screening every year starting at age 41 if you have a 30-pack-year history of smoking and currently smoke or have quit within the past 15 years. Fecal occult blood test (FOBT) of the stool. You may have this test every year starting at age 48. Flexible sigmoidoscopy or colonoscopy. You may have a sigmoidoscopy every 5 years or a colonoscopy every 10 years starting at age 51. Prostate cancer screening. Recommendations will vary depending on your family history and other risks. Hepatitis C blood test. Hepatitis B blood test. Sexually transmitted disease (STD) testing. Diabetes screening. This is done by checking your blood sugar (glucose) after you have not eaten for a while (fasting). You may have this done every 1-3 years. Abdominal aortic aneurysm (AAA) screening. You may need this if you are a current or former smoker. Osteoporosis. You may be  screened starting at age 76 if you are at high risk. Talk with your health care provider about your test results, treatment options, and if necessary, the need for more tests. Vaccines  Your health care provider may recommend certain vaccines, such as: Influenza vaccine. This is recommended every year. Tetanus, diphtheria, and acellular pertussis (Tdap, Td) vaccine. You may need  a Td booster every 10 years. Zoster vaccine. You may need this after age 85. Pneumococcal 13-valent conjugate (PCV13) vaccine. One dose is recommended after age 51. Pneumococcal polysaccharide (PPSV23) vaccine. One dose is recommended after age 42. Talk to your health care provider about which screenings and vaccines you need and how often you need them. This information is not intended to replace advice given to you by your health care provider. Make sure you discuss any questions you have with your health care provider. Document Released: 06/19/2015 Document Revised: 02/10/2016 Document Reviewed: 03/24/2015 Elsevier Interactive Patient Education  2017 [employer] Prevention in the Home Falls can cause injuries. They can happen to people of all ages. There are many things you can do to make your home safe and to help prevent falls. What can I do on the outside of my home? Regularly fix the edges of walkways and driveways and fix any cracks. Remove anything that might make you trip as you walk through a door, such as a raised step or threshold. Trim any bushes or trees on the path to your home. Use bright outdoor lighting. Clear any walking paths of anything that might make someone trip, such as rocks or tools. Regularly check to see if handrails are loose or broken. Make sure that both sides of any steps have handrails. Any raised decks and porches should have guardrails on the edges. Have any leaves, snow, or ice cleared regularly. Use sand or salt on walking paths during winter. Clean up any spills in your garage right away. This includes oil or grease spills. What can I do in the bathroom? Use night lights. Install grab bars by the toilet and in the tub and shower. Do not use towel bars as grab bars. Use non-skid mats or decals in the tub or shower. If you need to sit down in the shower, use a plastic, non-slip stool. Keep the floor dry. Clean up any water that spills on the  floor as soon as it happens. Remove soap buildup in the tub or shower regularly. Attach bath mats securely with double-sided non-slip rug tape. Do not have throw rugs and other things on the floor that can make you trip. What can I do in the bedroom? Use night lights. Make sure that you have a light by your bed that is easy to reach. Do not use any sheets or blankets that are too big for your bed. They should not hang down onto the floor. Have a firm chair that has side arms. You can use this for support while you get dressed. Do not have throw rugs and other things on the floor that can make you trip. What can I do in the kitchen? Clean up any spills right away. Avoid walking on wet floors. Keep items that you use a lot in easy-to-reach places. If you need to reach something above you, use a strong step stool that has a grab bar. Keep electrical cords out of the way. Do not use floor polish or wax that makes floors slippery. If you must use wax, use non-skid floor wax. Do not have  throw rugs and other things on the floor that can make you trip. What can I do with my stairs? Do not leave any items on the stairs. Make sure that there are handrails on both sides of the stairs and use them. Fix handrails that are broken or loose. Make sure that handrails are as long as the stairways. Check any carpeting to make sure that it is firmly attached to the stairs. Fix any carpet that is loose or worn. Avoid having throw rugs at the top or bottom of the stairs. If you do have throw rugs, attach them to the floor with carpet tape. Make sure that you have a light switch at the top of the stairs and the bottom of the stairs. If you do not have them, ask someone to add them for you. What else can I do to help prevent falls? Wear shoes that: Do not have high heels. Have rubber bottoms. Are comfortable and fit you well. Are closed at the toe. Do not wear sandals. If you use a stepladder: Make sure that  it is fully opened. Do not climb a closed stepladder. Make sure that both sides of the stepladder are locked into place. Ask someone to hold it for you, if possible. Clearly mark and make sure that you can see: Any grab bars or handrails. First and last steps. Where the edge of each step is. Use tools that help you move around (mobility aids) if they are needed. These include: Canes. Walkers. Scooters. Crutches. Turn on the lights when you go into a dark area. Replace any light bulbs as soon as they burn out. Set up your furniture so you have a clear path. Avoid moving your furniture around. If any of your floors are uneven, fix them. If there are any pets around you, be aware of where they are. Review your medicines with your doctor. Some medicines can make you feel dizzy. This can increase your chance of falling. Ask your doctor what other things that you can do to help prevent falls. This information is not intended to replace advice given to you by your health care provider. Make sure you discuss any questions you have with your health care provider. Document Released: 03/19/2009 Document Revised: 10/29/2015 Document Reviewed: 06/27/2014 Elsevier Interactive Patient Education  2017 [employer].

## 2022-11-15 DIAGNOSIS — L570 Actinic keratosis: Secondary | ICD-10-CM | POA: Diagnosis not present

## 2022-11-15 DIAGNOSIS — D225 Melanocytic nevi of trunk: Secondary | ICD-10-CM | POA: Diagnosis not present

## 2022-11-15 DIAGNOSIS — L814 Other melanin hyperpigmentation: Secondary | ICD-10-CM | POA: Diagnosis not present

## 2022-11-15 DIAGNOSIS — D2261 Melanocytic nevi of right upper limb, including shoulder: Secondary | ICD-10-CM | POA: Diagnosis not present

## 2022-11-15 DIAGNOSIS — D2272 Melanocytic nevi of left lower limb, including hip: Secondary | ICD-10-CM | POA: Diagnosis not present

## 2022-11-15 DIAGNOSIS — D1801 Hemangioma of skin and subcutaneous tissue: Secondary | ICD-10-CM | POA: Diagnosis not present

## 2022-11-22 ENCOUNTER — Ambulatory Visit (INDEPENDENT_AMBULATORY_CARE_PROVIDER_SITE_OTHER): Payer: PPO | Admitting: Family Medicine

## 2022-11-22 ENCOUNTER — Other Ambulatory Visit (HOSPITAL_COMMUNITY)
Admission: RE | Admit: 2022-11-22 | Discharge: 2022-11-22 | Disposition: A | Discharge status: Home / Self Care | Payer: PPO | Source: Ambulatory Visit | Attending: Family Medicine | Admitting: Family Medicine

## 2022-11-22 ENCOUNTER — Encounter: Payer: Self-pay | Admitting: Family Medicine

## 2022-11-22 VITALS — BP 127/82 | HR 62 | Temp 97.8°F | Resp 16 | Ht 71.5 in | Wt 216.1 lb

## 2022-11-22 DIAGNOSIS — R351 Nocturia: Secondary | ICD-10-CM | POA: Diagnosis not present

## 2022-11-22 DIAGNOSIS — Z113 Encounter for screening for infections with a predominantly sexual mode of transmission: Secondary | ICD-10-CM | POA: Diagnosis not present

## 2022-11-22 DIAGNOSIS — N481 Balanitis: Secondary | ICD-10-CM | POA: Diagnosis not present

## 2022-11-22 DIAGNOSIS — Z0000 Encounter for general adult medical examination without abnormal findings: Secondary | ICD-10-CM | POA: Diagnosis not present

## 2022-11-22 LAB — COMPREHENSIVE METABOLIC PANEL
ALT: 17 U/L (ref 0–53)
AST: 29 U/L (ref 0–37)
Albumin: 4.5 g/dL (ref 3.5–5.2)
Alkaline Phosphatase: 38 U/L — ABNORMAL LOW (ref 39–117)
BUN: 15 mg/dL (ref 6–23)
CO2: 28 mEq/L (ref 19–32)
Calcium: 9.8 mg/dL (ref 8.4–10.5)
Chloride: 102 mEq/L (ref 96–112)
Creatinine, Ser: 0.99 mg/dL (ref 0.40–1.50)
GFR: 77.12 mL/min (ref >60.00)
Glucose, Bld: 95 mg/dL (ref 70–99)
Potassium: 5.2 mEq/L — ABNORMAL HIGH (ref 3.5–5.1)
Sodium: 139 mEq/L (ref 135–145)
Total Bilirubin: 0.7 mg/dL (ref 0.2–1.2)
Total Protein: 7.3 g/dL (ref 6.0–8.3)

## 2022-11-22 LAB — LIPID PANEL
Cholesterol: 227 mg/dL — ABNORMAL HIGH (ref 0–200)
HDL: 50.3 mg/dL (ref >39.00)
NonHDL: 176.21
Total CHOL/HDL Ratio: 5
Triglycerides: 212 mg/dL — ABNORMAL HIGH (ref 0.0–149.0)
VLDL: 42.4 mg/dL — ABNORMAL HIGH (ref 0.0–40.0)

## 2022-11-22 LAB — POC URINALSYSI DIPSTICK (AUTOMATED)
Bilirubin, UA: NEGATIVE
Blood, UA: NEGATIVE
Glucose, UA: NEGATIVE
Ketones, UA: NEGATIVE
Leukocytes, UA: NEGATIVE
Nitrite, UA: NEGATIVE
Protein, UA: NEGATIVE
Spec Grav, UA: >=1.03 — AB (ref 1.010–1.025)
Urobilinogen, UA: 0.2 E.U./dL
pH, UA: 6 (ref 5.0–8.0)

## 2022-11-22 LAB — LDL CHOLESTEROL, DIRECT: Direct LDL: 158 mg/dL

## 2022-11-22 LAB — CBC WITH DIFFERENTIAL/PLATELET
Basophils Absolute: 0 10*3/uL (ref 0.0–0.1)
Basophils Relative: 0.5 % (ref 0.0–3.0)
Eosinophils Absolute: 0.2 10*3/uL (ref 0.0–0.7)
Eosinophils Relative: 2.9 % (ref 0.0–5.0)
HCT: 46.9 % (ref 39.0–52.0)
Hemoglobin: 15.7 g/dL (ref 13.0–17.0)
Lymphocytes Relative: 27.3 % (ref 12.0–46.0)
Lymphs Abs: 1.9 10*3/uL (ref 0.7–4.0)
MCHC: 33.3 g/dL (ref 30.0–36.0)
MCV: 87.6 fl (ref 78.0–100.0)
Monocytes Absolute: 0.5 10*3/uL (ref 0.1–1.0)
Monocytes Relative: 6.6 % (ref 3.0–12.0)
Neutro Abs: 4.4 10*3/uL (ref 1.4–7.7)
Neutrophils Relative %: 62.7 % (ref 43.0–77.0)
Platelets: 252 10*3/uL (ref 150.0–400.0)
RBC: 5.36 Mil/uL (ref 4.22–5.81)
RDW: 13.7 % (ref 11.5–15.5)
WBC: 7 10*3/uL (ref 4.0–10.5)

## 2022-11-22 LAB — HEMOGLOBIN A1C: Hgb A1c MFr Bld: 5.5 % (ref 4.6–6.5)

## 2022-11-22 LAB — PSA: PSA: 0.49 ng/mL (ref 0.10–4.00)

## 2022-11-22 LAB — TSH: TSH: 2.29 u[IU]/mL (ref 0.35–5.50)

## 2022-11-22 MED ORDER — KETOCONAZOLE 2 % EX CREA: 1.0000 | TOPICAL_CREAM | Freq: Two times a day (BID) | CUTANEOUS | 0 refills | Status: DC

## 2022-11-22 NOTE — Progress Notes (Signed)
Labs look great except cholesterol(wasn't fasting).  Does he wamt the patient work on diet/exercise and repeat 6 months fasting or wait till next year.

## 2022-11-22 NOTE — Progress Notes (Signed)
Phone: [phone]   Subjective:  Patient 72 y.o. male presenting for annual physical.  Chief Complaint  Patient presents with   Annual Exam    CPE Not fasting   Annual-good diet.  Mostly vegetables.  Occasional meats. NAS.  Exercises daily 3-5 miles.  Didn't get colon done yet.  He will get Tetanus, Diphtheria, and Pertussis (Tdap) at pharmacy   See problem oriented charting- ROS- ROS: Gen: no fever, chills  Skin: no rash, itching ENT: no ear pain, ear drainage, nasal congestion, rhinorrhea, sinus pressure, sore throat.  Some Spring allergies. Eyes: no blurry vision, double vision Resp: no cough, wheeze,SOB CV: no CP, palpitations, LE edema,  GI: no heartburn, n/v/d/c, abd pain GU: no dysuria, urgency, frequency, hematuria.  Past 1 year(s), some redness around head of penis. There before shower.  Then rinses off.  Not SA.  Some fish odor.   MSK: no joint pain, myalgias, back pain Neuro: no dizziness, headache, weakness, vertigo Psych: no depression, anxiety, insomnia, SI   The following were reviewed and entered/updated in epic: Past Medical History:  Diagnosis Date   Allergy    There are no problems to display for this patient.  History reviewed. No pertinent surgical history.  Family History  Problem Relation Age of Onset   Diabetes Mother    Lung cancer Mother    Dementia Father    Aneurysm Sister    Heart disease Maternal Grandmother    Early death Maternal Grandmother    Diabetes Maternal Grandmother    Breast cancer Maternal Grandmother    Stroke Maternal Grandfather     Medications- reviewed and updated Current Outpatient Medications  Medication Sig Dispense Refill   ASPIRIN 81 PO Take by mouth daily.     ibuprofen (ADVIL) 200 MG tablet Take 200 mg by mouth daily.     ketoconazole (NIZORAL) 2 % cream Apply 1 Application topically 2 (two) times daily. 60 g 0   No current facility-administered medications for this visit.    Allergies-reviewed and  updated No Known Allergies  Social History   Social History Narrative   [employer]-florist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     Objective  Objective:  BP 127/82   Pulse 62   Temp 97.8 F (36.6 C) (Temporal)   Resp 16   Ht 5' 11.5" (1.816 m)   Wt 216 lb 2 oz (98 kg)   SpO2 98%   BMI 29.72 kg/m  Physical Exam  Gen: WDWN NAD HEENT: NCAT, conjunctiva not injected, sclera nonicteric TM WNL B, OP moist, no exudates  NECK:  supple, no thyromegaly, no nodes, no carotid bruits CARDIAC: RRR, S1S2+, no murmur. DP 2+B LUNGS: CTAB. No wheezes ABDOMEN:  BS+, soft, NTND, No HSM, no masses Circ male but some folding skin:  some redness in folds and corona.  No D/C.   EXT:  no edema MSK: no gross abnormalities. MS 5/5 all 4 NEURO: A&O x3.  CN II-XII intact.  PSYCH: normal mood. Good eye contact   Results for orders placed or performed in visit on 11/22/22  POCT Urinalysis Dipstick (Automated)  Result Value Ref Range   Color, UA YELLOW    Clarity, UA CLEAR    Glucose, UA Negative Negative   Bilirubin, UA NEG    Ketones, UA NEG    Spec Grav, UA >=1.030 (A) 1.010 - 1.025   Blood, UA NEG    pH, UA 6.0 5.0 - 8.0   Protein,  UA Negative Negative   Urobilinogen, UA 0.2 0.2 or 1.0 E.U./dL   Nitrite, UA NEG    Leukocytes, UA Negative Negative       Assessment and Plan   Health Maintenance counseling: 1. Anticipatory guidance: Patient counseled regarding regular dental exams q6 months, eye exams yearly, avoiding smoking and second hand smoke, limiting alcohol to 2 beverages per day.   2. Risk factor reduction:  Advised patient of need for regular exercise and diet rich in fruits and vegetables to reduce risk of heart attack and stroke. Exercise- +.   Wt Readings from Last 3 Encounters:  11/22/22 216 lb 2 oz (98 kg)  06/13/22 212 lb (96.2 kg)  11/19/21 215 lb (97.5 kg)   3. Immunizations/screenings/ancillary studies Immunization History  Administered  Date(s) Administered   COVID-19, mRNA, vaccine(Comirnaty)12 years and older 04/06/2022   Influenza-Unspecified 04/06/2022   PFIZER Comirnaty(Gray Top)Covid-19 Tri-Sucrose Vaccine 03/03/2020   PFIZER(Purple Top)SARS-COV-2 Vaccination 07/10/2019, 07/31/2019, 03/03/2020, 09/28/2020   Pfizer Covid-19 Vaccine Bivalent Booster 39yrs & up 02/16/2021   Pneumococcal Conjugate-13 05/11/2021   Pneumococcal Polysaccharide-23 09/14/2020   RSV,unspecified 04/06/2022   Zoster Recombinat (Shingrix) 02/16/2021, 05/27/2021   Health Maintenance Due  Topic Date Due   DTaP/Tdap/Td (1 - Tdap) Never done   Colonoscopy  Never done    4. Prostate cancer screening >55yo - risk factors? No results found for: "PSA"  5. Colon cancer screening:patient will sch 6. Skin cancer screening- Iadvised regular sunscreen use. Denies worrisome, changing, or new skin lesions.  7. Smoking associated screening (lung cancer screening, AAA screen 65-75, UA)- non smoker8. STD screening - checked  Wellness examination -     Comprehensive metabolic panel -     Lipid panel -     TSH -     Hemoglobin A1c  Nocturia -     CBC with Differential/Platelet -     PSA -     POCT Urinalysis Dipstick (Automated)  Routine screening for STI (sexually transmitted infection) -     Urine cytology ancillary only  Balanitis  Other orders -     Ketoconazole; Apply 1 Application topically 2 (two) times daily.  Dispense: 60 g; Refill: 0   Wellness-anticipatory guidance.  Work on Diet/Exercise  Check CBC,CMP,lipids,TSH, A1C.  F/u 1 yr   patient aware labs aren't covered-he still wants to do.  Will get Tetanus, Diphtheria, and Pertussis (Tdap) at pharmacy.   Urinary symptoms-check PSA, urinalysis,  Balantitis-check for STD(doubt).  Ketoconazole cream.  Keep clean and dry  Recommended follow up: Return in about 1 year (around 11/22/2023) for annual physical.  Lab/Order associations:+ fasting   Angelena Sole, MD

## 2022-11-22 NOTE — Patient Instructions (Signed)

## 2022-11-23 LAB — URINE CYTOLOGY ANCILLARY ONLY
Chlamydia: NEGATIVE
Comment: NEGATIVE
Comment: NEGATIVE
Comment: NORMAL
Neisseria Gonorrhea: NEGATIVE
Trichomonas: NEGATIVE

## 2022-12-09 ENCOUNTER — Encounter: Payer: Self-pay | Admitting: Family Medicine

## 2023-06-19 ENCOUNTER — Ambulatory Visit (INDEPENDENT_AMBULATORY_CARE_PROVIDER_SITE_OTHER): Payer: PPO

## 2023-06-19 VITALS — Wt 216.0 lb

## 2023-06-19 DIAGNOSIS — Z0000 Encounter for general adult medical examination without abnormal findings: Secondary | ICD-10-CM

## 2023-06-19 NOTE — Patient Instructions (Addendum)
 Mr. Russell Mckee , Thank you for taking time to come for your Medicare Wellness Visit. I appreciate your ongoing commitment to your health goals. Please review the following plan we discussed and let me know if I can assist you in the future.   Referrals/Orders/Follow-Ups/Clinician Recommendations: increase exercise and continue to maintain health and activity    Colonoscopy screening Due:  [facility]  [address] [place] [address] [place],  [place]  [address] Main: [phone]  Cologuard by [employer] [phone]    This is a list of the screening recommended for you and due dates:  Health Maintenance  Topic Date Due   Colon Cancer Screening  Never done   Flu Shot  01/05/2023   COVID-19 Vaccine (7 - 2024-25 season) 02/05/2023   Medicare Annual Wellness Visit  06/18/2024   DTaP/Tdap/Td vaccine (2 - Td or Tdap) 11/22/2032   Pneumonia Vaccine  Completed   Hepatitis C Screening  Completed   Zoster (Shingles) Vaccine  Completed   HPV Vaccine  Aged Out    Advanced directives: (Copy Requested) Please bring a copy of your health care power of attorney and living will to the office to be added to your chart at your convenience.  Next Medicare Annual Wellness Visit scheduled for next year: Yes

## 2023-06-19 NOTE — Progress Notes (Addendum)
 Subjective:   Russell Mckee is a 73 y.o. male who presents for Medicare Annual/Subsequent preventive examination.  Visit Complete: Virtual I connected with  Russell Mckee on 06/19/23 by a video and audio enabled telemedicine application and verified that I am speaking with the correct person using two identifiers.  Patient Location: Home  Provider Location: Office/Clinic  I discussed the limitations of evaluation and management by telemedicine. The patient expressed understanding and agreed to proceed.  Vital Signs: Because this visit was a virtual/telehealth visit, some criteria may be missing or patient reported. Any vitals not documented were not able to be obtained and vitals that have been documented are patient reported. Cardiac Risk Factors include: advanced age (>19men, >95 women);male gender     Objective:    Today's Vitals   06/19/23 1400  Weight: 216 lb (98 kg)   Body mass index is 29.71 kg/m.     06/19/2023    2:09 PM 06/13/2022    2:15 PM  Advanced Directives  Does Patient Have a Medical Advance Directive? Yes Yes  Type of Estate Agent of [place];Living will Healthcare Power of [place];Living will  Copy of Healthcare Power of Attorney in Chart? No - copy requested No - copy requested    Current Medications (verified) Outpatient Encounter Medications as of 06/19/2023  Medication Sig   ASPIRIN 81 PO Take by mouth 3 (three) times a week.   ibuprofen (ADVIL) 200 MG tablet Take 200 mg by mouth daily.   [DISCONTINUED] ketoconazole  (NIZORAL ) 2 % cream Apply 1 Application topically 2 (two) times daily.   No facility-administered encounter medications on file as of 06/19/2023.    Allergies (verified) Patient has no known allergies.   History: Past Medical History:  Diagnosis Date   Allergy    History reviewed. No pertinent surgical history. Family History  Problem Relation Age of Onset   Diabetes Mother    Lung cancer Mother     Dementia Father    Aneurysm Sister    Heart disease Maternal Grandmother    Early death Maternal Grandmother    Diabetes Maternal Grandmother    Breast cancer Maternal Grandmother    Stroke Maternal Grandfather    Social History   Socioeconomic History   Marital status: Married    Spouse name: Radio Producer   Number of children: 0   Years of education: Not on file   Highest education level: Not on file  Occupational History   Not on file  Tobacco Use   Smoking status: Former    Current packs/day: 0.00    Types: Cigarettes    Quit date: 1979    Years since quitting: 46.0   Smokeless tobacco: Never  Vaping Use   Vaping status: Not on file  Substance and Sexual Activity   Alcohol use: Yes    Alcohol/week: 2.0 standard drinks of alcohol    Types: 2 Shots of liquor per week   Drug use: Not Currently   Sexual activity: Never  Other Topics Concern   Not on file  Social History Narrative   [employer]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     Social Drivers of Corporate Investment Banker Strain: Low Risk  (06/19/2023)   Overall Financial Resource Strain (CARDIA)    Difficulty of Paying Living Expenses: Not hard at all  Food Insecurity: No Food Insecurity (06/19/2023)   Hunger Vital Sign    Worried About Programme Researcher, Broadcasting/film/video in  the Last Year: Never true    Ran Out of Food in the Last Year: Never true  Transportation Needs: No Transportation Needs (06/19/2023)   PRAPARE - Administrator, Civil Service (Medical): No    Lack of Transportation (Non-Medical): No  Physical Activity: Sufficiently Active (06/19/2023)   Exercise Vital Sign    Days of Exercise per Week: 5 days    Minutes of Exercise per Session: 80 min  Stress: No Stress Concern Present (06/19/2023)   [employer] of Occupational Health - Occupational Stress Questionnaire    Feeling of Stress : Not at all  Social Connections: Moderately Isolated (06/19/2023)   Social Connection  and Isolation Panel [NHANES]    Frequency of Communication with Friends and Family: More than three times a week    Frequency of Social Gatherings with Friends and Family: More than three times a week    Attends Religious Services: Never    Database Administrator or Organizations: No    Attends Engineer, Structural: Never    Marital Status: Married    Tobacco Counseling Counseling given: Not Answered   Clinical Intake:  Pre-visit preparation completed: Yes  Pain : No/denies pain     BMI - recorded: 29.71 Nutritional Status: BMI 25 -29 Overweight Nutritional Risks: None Diabetes: No  How often do you need to have someone help you when you read instructions, pamphlets, or other written materials from your doctor or pharmacy?: 1 - Never  Interpreter Needed?: No  Information entered by :: Ellouise Haws, LPN   Activities of Daily Living    06/19/2023    2:11 PM  In your present state of health, do you have any difficulty performing the following activities:  Hearing? 0  Vision? 0  Difficulty concentrating or making decisions? 0  Walking or climbing stairs? 0  Dressing or bathing? 0  Doing errands, shopping? 0  Preparing Food and eating ? N  Using the Toilet? N  In the past six months, have you accidently leaked urine? N  Do you have problems with loss of bowel control? N  Managing your Medications? N  Managing your Finances? N  Housekeeping or managing your Housekeeping? N    Patient Care Team: Wendolyn Jenkins Jansky, MD as PCP - General (Family Medicine)  Indicate any recent Medical Services you may have received from other than [facility] providers in the past year (date may be approximate).     Assessment:   This is a routine wellness examination for Russell Mckee.  Hearing/Vision screen Hearing Screening - Comments:: Pt denies any hearing issues  Vision Screening - Comments:: Pt follows up with [facility] ophthalmology for annual eye exams    Goals Addressed              This Visit's Progress    Patient Stated       More exercise        Depression Screen    06/19/2023    2:09 PM 11/22/2022    8:50 AM 06/13/2022    2:13 PM 11/19/2021   10:42 AM 11/19/2021   10:41 AM  PHQ 2/9 Scores  PHQ - 2 Score 0 0 0 0 0  PHQ- 9 Score  1  1     Fall Risk    06/19/2023    2:11 PM 11/22/2022    8:50 AM 06/13/2022    2:16 PM 11/19/2021   10:33 AM  Fall Risk   Falls in the past year? 0 0  0 1  Number falls in past yr: 0 0 0 0  Injury with Fall? 0 0 0 0  Comment    sore knee  Risk for fall due to : No Fall Risks No Fall Risks Impaired vision Other (Comment)  Risk for fall due to: Comment    slipped  Follow up Falls prevention discussed Falls evaluation completed Falls prevention discussed Falls prevention discussed    MEDICARE RISK AT HOME: Medicare Risk at Home Any stairs in or around the home?: Yes If so, are there any without handrails?: No Home free of loose throw rugs in walkways, pet beds, electrical cords, etc?: Yes Adequate lighting in your home to reduce risk of falls?: Yes Life alert?: No Use of a cane, walker or w/c?: No Grab bars in the bathroom?: Yes Shower chair or bench in shower?: Yes Elevated toilet seat or a handicapped toilet?: No  TIMED UP AND GO:  Was the test performed?  No    Cognitive Function:        06/19/2023    2:11 PM 06/13/2022    2:16 PM  6CIT Screen  What Year? 0 points 0 points  What month? 0 points 0 points  What time? 0 points 0 points  Count back from 20 0 points 0 points  Months in reverse 0 points 0 points  Repeat phrase 0 points 0 points  Total Score 0 points 0 points    Immunizations Immunization History  Administered Date(s) Administered   Influenza-Unspecified 04/06/2022, 04/21/2023   PFIZER(Purple Top)SARS-COV-2 Vaccination 07/10/2019, 07/31/2019, 03/03/2020, 09/28/2020   Pfizer Covid-19 Vaccine Bivalent Booster 59yrs & up 02/16/2021   Pfizer(Comirnaty)Fall Seasonal Vaccine 12 years  and older 04/06/2022   Pneumococcal Conjugate-13 05/11/2021   Pneumococcal Polysaccharide-23 09/14/2020   RSV,unspecified 04/06/2022   Tdap 11/23/2022   Zoster Recombinant(Shingrix) 02/16/2021, 05/27/2021    TDAP status: Up to date  Flu Vaccine status: Up to date  Pneumococcal vaccine status: Up to date  Covid-19 vaccine status: Information provided on how to obtain vaccines.   Qualifies for Shingles Vaccine? Yes   Zostavax completed Yes   Shingrix Completed?: Yes  Screening Tests Health Maintenance  Topic Date Due   Colonoscopy  Never done   COVID-19 Vaccine (7 - 2024-25 season) 02/05/2023   Medicare Annual Wellness (AWV)  06/18/2024   DTaP/Tdap/Td (2 - Td or Tdap) 11/22/2032   Pneumonia Vaccine 77+ Years old  Completed   INFLUENZA VACCINE  Completed   Hepatitis C Screening  Completed   Zoster Vaccines- Shingrix  Completed   HPV VACCINES  Aged Out    Health Maintenance  Health Maintenance Due  Topic Date Due   Colonoscopy  Never done   COVID-19 Vaccine (7 - 2024-25 season) 02/05/2023       Additional Screening:  Hepatitis C Screening:  Completed 11/19/21 / Vision Screening: Recommended annual ophthalmology exams for early detection of glaucoma and other disorders of the eye. Is the patient up to date with their annual eye exam?  Yes  Who is the provider or what is the name of the office in which the patient attends annual eye exams? [facility] ophthalmology  If pt is not established with a provider, would they like to be referred to a provider to establish care? No .   Dental Screening: Recommended annual dental exams for proper oral hygiene  Community Resource Referral / Chronic Care Management: CRR required this visit?  No   CCM required this visit?  No  Plan:     I have personally reviewed and noted the following in the patient's chart:   Medical and social history Use of alcohol, tobacco or illicit drugs  Current medications and supplements  including opioid prescriptions. Patient is not currently taking opioid prescriptions. Functional ability and status Nutritional status Physical activity Advanced directives List of other physicians Hospitalizations, surgeries, and ER visits in previous 12 months Vitals Screenings to include cognitive, depression, and falls Referrals and appointments  In addition, I have reviewed and discussed with patient certain preventive protocols, quality metrics, and best practice recommendations. A written personalized care plan for preventive services as well as general preventive health recommendations were provided to patient.     Ellouise VEAR Haws, LPN   [id number]   After Visit Summary: (MyChart) Due to this being a telephonic visit, the after visit summary with patients personalized plan was offered to patient via MyChart   Nurse Notes: none

## 2023-08-14 DIAGNOSIS — H2513 Age-related nuclear cataract, bilateral: Secondary | ICD-10-CM | POA: Diagnosis not present

## 2023-08-14 DIAGNOSIS — H04221 Epiphora due to insufficient drainage, right lacrimal gland: Secondary | ICD-10-CM | POA: Diagnosis not present

## 2023-08-14 DIAGNOSIS — H04222 Epiphora due to insufficient drainage, left lacrimal gland: Secondary | ICD-10-CM | POA: Diagnosis not present

## 2023-08-14 DIAGNOSIS — H04563 Stenosis of bilateral lacrimal punctum: Secondary | ICD-10-CM | POA: Diagnosis not present

## 2023-08-14 DIAGNOSIS — H35373 Puckering of macula, bilateral: Secondary | ICD-10-CM | POA: Diagnosis not present

## 2023-11-23 ENCOUNTER — Encounter: Payer: Self-pay | Admitting: Family Medicine

## 2023-11-23 ENCOUNTER — Ambulatory Visit: Payer: Self-pay | Admitting: Family Medicine

## 2023-11-23 ENCOUNTER — Ambulatory Visit (INDEPENDENT_AMBULATORY_CARE_PROVIDER_SITE_OTHER): Payer: PPO | Admitting: Family Medicine

## 2023-11-23 VITALS — BP 129/83 | HR 63 | Temp 97.5°F | Resp 18 | Ht 71.5 in | Wt 216.4 lb

## 2023-11-23 DIAGNOSIS — Z1211 Encounter for screening for malignant neoplasm of colon: Secondary | ICD-10-CM

## 2023-11-23 DIAGNOSIS — Z0000 Encounter for general adult medical examination without abnormal findings: Secondary | ICD-10-CM

## 2023-11-23 DIAGNOSIS — E782 Mixed hyperlipidemia: Secondary | ICD-10-CM | POA: Insufficient documentation

## 2023-11-23 DIAGNOSIS — Z1212 Encounter for screening for malignant neoplasm of rectum: Secondary | ICD-10-CM

## 2023-11-23 LAB — COMPREHENSIVE METABOLIC PANEL WITH GFR
ALT: 16 U/L (ref 0–53)
AST: 28 U/L (ref 0–37)
Albumin: 4.6 g/dL (ref 3.5–5.2)
Alkaline Phosphatase: 36 U/L — ABNORMAL LOW (ref 39–117)
BUN: 15 mg/dL (ref 6–23)
CO2: 24 meq/L (ref 19–32)
Calcium: 9.5 mg/dL (ref 8.4–10.5)
Chloride: 104 meq/L (ref 96–112)
Creatinine, Ser: 0.9 mg/dL (ref 0.40–1.50)
GFR: 85.85 mL/min (ref >60.00)
Glucose, Bld: 94 mg/dL (ref 70–99)
Potassium: 4.4 meq/L (ref 3.5–5.1)
Sodium: 136 meq/L (ref 135–145)
Total Bilirubin: 0.6 mg/dL (ref 0.2–1.2)
Total Protein: 7.3 g/dL (ref 6.0–8.3)

## 2023-11-23 LAB — TSH: TSH: 2.21 u[IU]/mL (ref 0.35–5.50)

## 2023-11-23 LAB — LIPID PANEL
Cholesterol: 212 mg/dL — ABNORMAL HIGH (ref 0–200)
HDL: 51.9 mg/dL (ref >39.00)
LDL Cholesterol: 132 mg/dL — ABNORMAL HIGH (ref 0–99)
NonHDL: 159.89
Total CHOL/HDL Ratio: 4
Triglycerides: 137 mg/dL (ref 0.0–149.0)
VLDL: 27.4 mg/dL (ref 0.0–40.0)

## 2023-11-23 LAB — CBC WITH DIFFERENTIAL/PLATELET
Basophils Absolute: 0 10*3/uL (ref 0.0–0.1)
Basophils Relative: 0.6 % (ref 0.0–3.0)
Eosinophils Absolute: 0.2 10*3/uL (ref 0.0–0.7)
Eosinophils Relative: 3.2 % (ref 0.0–5.0)
HCT: 44.9 % (ref 39.0–52.0)
Hemoglobin: 15.4 g/dL (ref 13.0–17.0)
Lymphocytes Relative: 35.8 % (ref 12.0–46.0)
Lymphs Abs: 2 10*3/uL (ref 0.7–4.0)
MCHC: 34.2 g/dL (ref 30.0–36.0)
MCV: 85.5 fl (ref 78.0–100.0)
Monocytes Absolute: 0.5 10*3/uL (ref 0.1–1.0)
Monocytes Relative: 8.9 % (ref 3.0–12.0)
Neutro Abs: 2.9 10*3/uL (ref 1.4–7.7)
Neutrophils Relative %: 51.5 % (ref 43.0–77.0)
Platelets: 231 10*3/uL (ref 150.0–400.0)
RBC: 5.26 Mil/uL (ref 4.22–5.81)
RDW: 14 % (ref 11.5–15.5)
WBC: 5.6 10*3/uL (ref 4.0–10.5)

## 2023-11-23 LAB — HEMOGLOBIN A1C: Hgb A1c MFr Bld: 5.6 % (ref 4.6–6.5)

## 2023-11-23 NOTE — Progress Notes (Signed)
Labs ok except Your cholesterol levels are elevated.  Work on low cholesterol and lower carbs/sugars diet and  get exercise to try to lower your cholesterol.

## 2023-11-23 NOTE — Progress Notes (Signed)
 Phone: [phone]   Subjective:  Patient 73 y.o. male presenting for annual physical.  Chief Complaint  Patient presents with   Annual Exam    CPE Not fasting    Annual-active Discussed the use of AI scribe software for clinical note transcription with the patient, who gave verbal consent to proceed.  History of Present Illness Russell Mckee is a 73 year old male who presents with sleep disturbances and dry eye symptoms.  He experiences sleep disturbances characterized by waking up around 2 AM and staying awake for about an hour before falling back asleep. He goes to bed at 10 PM and wakes up at 4:30 AM without an alarm. He does not consume food late at night, typically having dinner around 7 PM. His diet mainly consists of fresh vegetables, with meat consumption limited to once or twice a month, and he abstains from alcohol except for occasional social drinking. He has not used marijuana for three years. No chronic headaches, dizziness, chest pain, shortness of breath, or gastrointestinal issues. He denies any feelings of depression or suicidal thoughts.  He has developed dry eye symptoms this year, with excessive tearing noted. He visited an ophthalmologist at [facility], where a tear duct procedure was performed to check for blockages. Over-the-counter eye drops have been helping alleviate the symptoms.  He maintains an active lifestyle, walking three miles daily at a brisk pace, which takes about 50 minutes. He resumed this routine in April after a six-week break due to left knee pain experienced in February. He does not take any chronic medications and has not had any surgeries.   See problem oriented charting- ROS- ROS: Gen: no fever, chills  Skin: no rash, itching ENT: no ear pain, ear drainage, nasal congestion, rhinorrhea, sinus pressure, sore throat. Spring allergies Eyes: no blurry vision, double vision Resp: no cough, wheeze,SOB CV: no CP,  palpitations, LE edema,  GI: no heartburn, n/v/d/c, abd pain GU: no dysuria, urgency, frequency, hematuria MSK: no joint pain, myalgias, back pain Neuro: no dizziness, headache, weakness, vertigo Psych: no depression, anxiety,  SI   The following were reviewed and entered/updated in epic: Past Medical History:  Diagnosis Date   Allergy    Patient Active Problem List   Diagnosis Date Noted   Mixed hyperlipidemia 11/23/2023   History reviewed. No pertinent surgical history.  Family History  Problem Relation Age of Onset   Diabetes Mother    Lung cancer Mother    Dementia Father    Aneurysm Sister    Heart disease Maternal Grandmother    Early death Maternal Grandmother    Diabetes Maternal Grandmother    Breast cancer Maternal Grandmother    Stroke Maternal Grandfather     Medications- reviewed and updated Current Outpatient Medications  Medication Sig Dispense Refill   ibuprofen (ADVIL) 200 MG tablet Take 200 mg by mouth daily.     ASPIRIN 81 PO Take by mouth 3 (three) times a week. (Patient not taking: Reported on 11/23/2023)     No current facility-administered medications for this visit.    Allergies-reviewed and updated No Known Allergies  Social History   Social History Narrative   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     Objective  Objective:  BP 129/83   Pulse 63   Temp (!) 97.5 F (36.4 C) (Temporal)   Resp 18   Ht 5' 11.5 (1.816 m)   Wt 216 lb 6 oz (98.1 kg)  SpO2 98%   BMI 29.76 kg/m  Physical Exam  Gen: WDWN NAD HEENT: NCAT, conjunctiva not injected, sclera nonicteric TM WNL B, OP moist, no exudates  NECK:  supple, no thyromegaly, no nodes, no carotid bruits CARDIAC: RRR, S1S2+, no murmur. DP 2+B LUNGS: CTAB. No wheezes ABDOMEN:  BS+, soft, NTND, No HSM, no masses EXT:  no edema MSK: no gross abnormalities. MS 5/5 all 4 NEURO: A&O x3.  CN II-XII intact.  PSYCH: normal mood. Good eye contact      Assessment and Plan   Health Maintenance counseling: 1. Anticipatory guidance: Patient counseled regarding regular dental exams q6 months, eye exams yearly, avoiding smoking and second hand smoke, limiting alcohol to 2 beverages per day.   2. Risk factor reduction:  Advised patient of need for regular exercise and diet rich in fruits and vegetables to reduce risk of heart attack and stroke. Exercise- +.   Wt Readings from Last 3 Encounters:  11/23/23 216 lb 6 oz (98.1 kg)  06/19/23 216 lb (98 kg)  11/22/22 216 lb 2 oz (98 kg)   3. Immunizations/screenings/ancillary studies Immunization History  Administered Date(s) Administered   Influenza-Unspecified 04/06/2022, 04/21/2023   PFIZER(Purple Top)SARS-COV-2 Vaccination 07/10/2019, 07/31/2019, 03/03/2020, 09/28/2020   Pfizer Covid-19 Vaccine Bivalent Booster 42yrs & up 02/16/2021   Pfizer(Comirnaty)Fall Seasonal Vaccine 12 years and older 04/06/2022   Pneumococcal Conjugate-13 05/11/2021   Pneumococcal Polysaccharide-23 09/14/2020   RSV,unspecified 04/06/2022   Tdap 11/23/2022   Zoster Recombinant(Shingrix) 02/16/2021, 05/27/2021   Health Maintenance Due  Topic Date Due   Colonoscopy  Never done    4. Prostate cancer screening >55yo - risk factors?  Lab Results  Component Value Date   PSA 0.49 11/22/2022    5. Colon cancer screening:ordered 6. Skin cancer screening- Iadvised regular sunscreen use. Denies worrisome, changing, or new skin lesions.  7. Smoking associated screening (lung cancer screening, AAA screen 65-75, UA)- non smoker- Wellness examination -     CBC with Differential/Platelet -     Comprehensive metabolic panel with GFR -     Lipid panel -     TSH -     Hemoglobin A1c  Mixed hyperlipidemia -     Comprehensive metabolic panel with GFR -     Lipid panel -     TSH -     Hemoglobin A1c  Screening for colorectal cancer -     Ambulatory referral to Gastroenterology   Annual-antic guidance Assessment and  Plan Assessment & Plan Sleep Disturbance   He has difficulty maintaining sleep, waking at 2 AM and staying awake for about an hour before returning to sleep, then waking again at 4:30 AM without an alarm. Anxiety or a busy mind may contribute. He previously used melatonin but stopped. Recommend magnesium glycinate before bed to aid sleep and reduce cramps, with a note on potential looser stools. Encourage meditation and reducing screen time before bed to calm the mind. Suggest a protein snack, such as [place] yogurt, before bed to prevent nocturnal hypoglycemia. Consider resuming melatonin if it was previously beneficial.  Dry Eye Syndrome   He reports excessive tearing and was diagnosed with dry eye by an ophthalmologist. Over-the-counter eye drops have been helpful.  Colonoscopy Screening   He has not had a recent colonoscopy and has no family history of colon cancer or previous polyps. Discussed colonoscopy for a ten-year screening interval versus Cologuard for a three-year interval. He prefers colonoscopy. Refer for colonoscopy.  General Health Maintenance   He  is active, exercises regularly, and maintains a healthy diet. He has high cholesterol, which may allow for Medicare coverage of certain labs. He has not had surgeries and does not take chronic medications. Order annual blood work to monitor health status, noting potential Medicare coverage due to high cholesterol.    Recommended follow up: Return in about 1 year (around 11/22/2024) for annual physical.  Lab/Order associations:2 hrs ago ate cereal fasting   Ellsworth Haas, MD

## 2023-11-23 NOTE — Patient Instructions (Addendum)
 It was very nice to see you today!  Magnesium glycinate.  Meditation.  Protein snack before bed.  Possibly melatonin.  Electrolytes.  Cure.    nuun   PLEASE NOTE:  If you had any lab tests please let us  know if you have not heard back within a few days. You may see your results on MyChart before we have a chance to review them but we will give you a call once they are reviewed by us . If we ordered any referrals today, please let us  know if you have not heard from their office within the next week.   Please try these tips to maintain a healthy lifestyle:  Eat most of your calories during the day when you are active. Eliminate processed foods including packaged sweets (pies, cakes, cookies), reduce intake of potatoes, white bread, white pasta, and white rice. Look for whole grain options, oat flour or almond flour.  Each meal should contain half fruits/vegetables, one quarter protein, and one quarter carbs (no bigger than a computer mouse).  Cut down on sweet beverages. This includes juice, soda, and sweet tea. Also watch fruit intake, though this is a healthier sweet option, it still contains natural sugar! Limit to 3 servings daily.  Drink at least 1 glass of water with each meal and aim for at least 8 glasses per day  Exercise at least 150 minutes every week.

## 2023-11-30 DIAGNOSIS — L570 Actinic keratosis: Secondary | ICD-10-CM | POA: Diagnosis not present

## 2023-11-30 DIAGNOSIS — D1801 Hemangioma of skin and subcutaneous tissue: Secondary | ICD-10-CM | POA: Diagnosis not present

## 2023-11-30 DIAGNOSIS — L814 Other melanin hyperpigmentation: Secondary | ICD-10-CM | POA: Diagnosis not present

## 2023-11-30 DIAGNOSIS — D224 Melanocytic nevi of scalp and neck: Secondary | ICD-10-CM | POA: Diagnosis not present

## 2023-11-30 DIAGNOSIS — D225 Melanocytic nevi of trunk: Secondary | ICD-10-CM | POA: Diagnosis not present

## 2024-06-20 ENCOUNTER — Ambulatory Visit: Payer: PPO
